# Patient Record
Sex: Female | Born: 1965 | Hispanic: No | Smoking: Current every day smoker
Health system: Southern US, Community
[De-identification: ages and names within clinical notes are randomized; demographics above are authoritative.]

---

## 1998-10-21 ENCOUNTER — Emergency Department (HOSPITAL_COMMUNITY): Admission: EM | Admit: 1998-10-21 | Discharge: 1998-10-21 | Payer: Self-pay | Admitting: Internal Medicine

## 1998-11-04 ENCOUNTER — Emergency Department (HOSPITAL_COMMUNITY): Admission: EM | Admit: 1998-11-04 | Discharge: 1998-11-04 | Payer: Self-pay

## 1998-11-11 ENCOUNTER — Emergency Department (HOSPITAL_COMMUNITY): Admission: EM | Admit: 1998-11-11 | Discharge: 1998-11-11 | Payer: Self-pay | Admitting: Emergency Medicine

## 1998-12-23 ENCOUNTER — Other Ambulatory Visit: Admission: RE | Admit: 1998-12-23 | Discharge: 1998-12-23 | Payer: Self-pay | Admitting: *Deleted

## 1998-12-23 ENCOUNTER — Encounter: Admission: RE | Admit: 1998-12-23 | Discharge: 1998-12-23 | Payer: Self-pay | Admitting: Family Medicine

## 2000-01-05 ENCOUNTER — Emergency Department (HOSPITAL_COMMUNITY): Admission: EM | Admit: 2000-01-05 | Discharge: 2000-01-05 | Payer: Self-pay | Admitting: Emergency Medicine

## 2000-01-19 ENCOUNTER — Encounter: Admission: RE | Admit: 2000-01-19 | Discharge: 2000-01-19 | Payer: Self-pay | Admitting: Family Medicine

## 2000-09-14 ENCOUNTER — Encounter: Payer: Self-pay | Admitting: Emergency Medicine

## 2000-09-14 ENCOUNTER — Emergency Department (HOSPITAL_COMMUNITY): Admission: EM | Admit: 2000-09-14 | Discharge: 2000-09-14 | Payer: Self-pay | Admitting: Emergency Medicine

## 2001-07-18 ENCOUNTER — Encounter: Admission: RE | Admit: 2001-07-18 | Discharge: 2001-07-18 | Payer: Self-pay | Admitting: Family Medicine

## 2002-10-17 ENCOUNTER — Encounter (INDEPENDENT_AMBULATORY_CARE_PROVIDER_SITE_OTHER): Payer: Self-pay | Admitting: *Deleted

## 2002-10-17 LAB — CONVERTED CEMR LAB

## 2002-10-18 ENCOUNTER — Encounter: Admission: RE | Admit: 2002-10-18 | Discharge: 2002-10-18 | Payer: Self-pay | Admitting: Family Medicine

## 2002-10-18 ENCOUNTER — Other Ambulatory Visit: Admission: RE | Admit: 2002-10-18 | Discharge: 2002-10-18 | Payer: Self-pay | Admitting: Family Medicine

## 2002-10-18 ENCOUNTER — Encounter: Payer: Self-pay | Admitting: Sports Medicine

## 2002-10-18 ENCOUNTER — Encounter: Admission: RE | Admit: 2002-10-18 | Discharge: 2002-10-18 | Payer: Self-pay | Admitting: Sports Medicine

## 2005-03-24 ENCOUNTER — Emergency Department (HOSPITAL_COMMUNITY): Admission: EM | Admit: 2005-03-24 | Discharge: 2005-03-24 | Payer: Self-pay | Admitting: Emergency Medicine

## 2006-06-15 DIAGNOSIS — F329 Major depressive disorder, single episode, unspecified: Secondary | ICD-10-CM

## 2006-06-15 DIAGNOSIS — R12 Heartburn: Secondary | ICD-10-CM | POA: Insufficient documentation

## 2006-06-15 DIAGNOSIS — G4700 Insomnia, unspecified: Secondary | ICD-10-CM | POA: Insufficient documentation

## 2006-06-15 DIAGNOSIS — F17200 Nicotine dependence, unspecified, uncomplicated: Secondary | ICD-10-CM

## 2006-06-15 DIAGNOSIS — N946 Dysmenorrhea, unspecified: Secondary | ICD-10-CM | POA: Insufficient documentation

## 2006-06-16 ENCOUNTER — Encounter (INDEPENDENT_AMBULATORY_CARE_PROVIDER_SITE_OTHER): Payer: Self-pay | Admitting: *Deleted

## 2006-09-07 ENCOUNTER — Ambulatory Visit: Payer: Self-pay | Admitting: Sports Medicine

## 2006-09-07 ENCOUNTER — Encounter: Payer: Self-pay | Admitting: Family Medicine

## 2006-09-07 DIAGNOSIS — R519 Headache, unspecified: Secondary | ICD-10-CM | POA: Insufficient documentation

## 2006-09-07 DIAGNOSIS — R51 Headache: Secondary | ICD-10-CM

## 2006-09-08 ENCOUNTER — Encounter: Payer: Self-pay | Admitting: *Deleted

## 2006-09-14 ENCOUNTER — Encounter: Payer: Self-pay | Admitting: Family Medicine

## 2006-09-21 ENCOUNTER — Telehealth: Payer: Self-pay | Admitting: Family Medicine

## 2006-09-25 ENCOUNTER — Telehealth (INDEPENDENT_AMBULATORY_CARE_PROVIDER_SITE_OTHER): Payer: Self-pay | Admitting: *Deleted

## 2006-11-08 ENCOUNTER — Telehealth: Payer: Self-pay | Admitting: Family Medicine

## 2007-02-01 ENCOUNTER — Telehealth (INDEPENDENT_AMBULATORY_CARE_PROVIDER_SITE_OTHER): Payer: Self-pay | Admitting: *Deleted

## 2007-02-09 ENCOUNTER — Ambulatory Visit: Payer: Self-pay | Admitting: Family Medicine

## 2007-02-09 ENCOUNTER — Encounter: Payer: Self-pay | Admitting: *Deleted

## 2007-03-13 ENCOUNTER — Ambulatory Visit (HOSPITAL_COMMUNITY): Admission: RE | Admit: 2007-03-13 | Discharge: 2007-03-13 | Payer: Self-pay | Admitting: Family Medicine

## 2007-07-12 ENCOUNTER — Ambulatory Visit: Payer: Self-pay | Admitting: Family Medicine

## 2007-07-12 DIAGNOSIS — J309 Allergic rhinitis, unspecified: Secondary | ICD-10-CM | POA: Insufficient documentation

## 2007-10-25 ENCOUNTER — Ambulatory Visit: Payer: Self-pay | Admitting: Family Medicine

## 2007-10-25 DIAGNOSIS — L219 Seborrheic dermatitis, unspecified: Secondary | ICD-10-CM | POA: Insufficient documentation

## 2008-01-14 ENCOUNTER — Telehealth: Payer: Self-pay | Admitting: *Deleted

## 2008-04-02 ENCOUNTER — Ambulatory Visit: Payer: Self-pay | Admitting: Family Medicine

## 2008-04-02 ENCOUNTER — Telehealth (INDEPENDENT_AMBULATORY_CARE_PROVIDER_SITE_OTHER): Payer: Self-pay | Admitting: Family Medicine

## 2008-04-02 ENCOUNTER — Encounter (INDEPENDENT_AMBULATORY_CARE_PROVIDER_SITE_OTHER): Payer: Self-pay | Admitting: Family Medicine

## 2008-04-02 DIAGNOSIS — N951 Menopausal and female climacteric states: Secondary | ICD-10-CM | POA: Insufficient documentation

## 2008-04-03 ENCOUNTER — Ambulatory Visit: Payer: Self-pay | Admitting: Family Medicine

## 2008-04-03 ENCOUNTER — Encounter (INDEPENDENT_AMBULATORY_CARE_PROVIDER_SITE_OTHER): Payer: Self-pay | Admitting: Family Medicine

## 2008-04-03 LAB — CONVERTED CEMR LAB
ALT: 15 units/L (ref 0–35)
AST: 16 units/L (ref 0–37)
Alkaline Phosphatase: 50 units/L (ref 39–117)
Creatinine, Ser: 0.88 mg/dL (ref 0.40–1.20)
FSH: 5.9 milliintl units/mL
TSH: 0.712 microintl units/mL (ref 0.350–4.50)
Total Bilirubin: 0.5 mg/dL (ref 0.3–1.2)
Total CHOL/HDL Ratio: 2.7
VLDL: 21 mg/dL (ref 0–40)

## 2008-04-04 ENCOUNTER — Encounter (INDEPENDENT_AMBULATORY_CARE_PROVIDER_SITE_OTHER): Payer: Self-pay | Admitting: Family Medicine

## 2008-05-02 ENCOUNTER — Encounter (INDEPENDENT_AMBULATORY_CARE_PROVIDER_SITE_OTHER): Payer: Self-pay | Admitting: Family Medicine

## 2008-05-02 ENCOUNTER — Ambulatory Visit (HOSPITAL_COMMUNITY): Admission: RE | Admit: 2008-05-02 | Discharge: 2008-05-02 | Payer: Self-pay | Admitting: Family Medicine

## 2008-09-08 ENCOUNTER — Emergency Department (HOSPITAL_COMMUNITY): Admission: EM | Admit: 2008-09-08 | Discharge: 2008-09-09 | Payer: Self-pay | Admitting: Emergency Medicine

## 2009-06-17 ENCOUNTER — Ambulatory Visit: Payer: Self-pay | Admitting: Family Medicine

## 2009-06-18 ENCOUNTER — Telehealth (INDEPENDENT_AMBULATORY_CARE_PROVIDER_SITE_OTHER): Payer: Self-pay | Admitting: *Deleted

## 2009-06-28 ENCOUNTER — Emergency Department (HOSPITAL_COMMUNITY): Admission: EM | Admit: 2009-06-28 | Discharge: 2009-06-28 | Payer: Self-pay | Admitting: Emergency Medicine

## 2009-07-09 ENCOUNTER — Ambulatory Visit (HOSPITAL_COMMUNITY): Admission: RE | Admit: 2009-07-09 | Discharge: 2009-07-09 | Payer: Self-pay | Admitting: Emergency Medicine

## 2009-09-28 ENCOUNTER — Telehealth: Payer: Self-pay | Admitting: *Deleted

## 2010-05-18 NOTE — Progress Notes (Signed)
Summary: Rx Prob  Phone Note Call from Patient Call back at [facility] Phone [phone]   Caller: Patient Summary of Call: The scripts that were sent in for her yesterday need to be sent to the [facility]. Initial call taken by: Clydell Hakim,  June 18, 2009 12:14 PM  Follow-up for Phone Call        Rx for flonase and claritin called to [facility] pharmacy. the otther meds they did not have. patient notified..called [facility] and cancelled the  claritin and flonase. Follow-up by: Theresia Lo RN,  June 18, 2009 5:12 PM

## 2010-05-18 NOTE — Assessment & Plan Note (Signed)
Summary: cpe,df   Vital Signs:  Patient profile:   45 year old female Height:      65.0 inches Weight:      126.1 pounds BMI:     21.06 Temp:     97.8 degrees F Pulse rate:   77 / minute BP sitting:   130 / 85  Vitals Entered By: Starleen Blue RN (June 17, 2009 9:51 AM) CC: cpp Is Patient Diabetic? No Pain Assessment Patient in pain? no        Primary Care Provider:  Eustaquio Boyden  MD  CC:  cpp.  History of Present Illness: CC: CPE  no concerns today.  last pap smear was 03/2008 - normal.  always had normal pap smears.  advised only needs q3 years, but patient desires pap smear today as all ready to do.  Last mammogram 02/2008 - normal.  would like repeat this year.    Smoking - 1ppd, contemplative.  Habits & Providers  Alcohol-Tobacco-Diet     Alcohol drinks/day: <1     Tobacco Status: current     Cigarette Packs/Day: 1.0  Exercise-Depression-Behavior     Drug Use: never     Seat Belt Use: always     Sun Exposure: infrequent  Current Medications (verified): 1)  Anaprox Ds 550 Mg  Tabs (Naproxen Sodium) .Marland Kitchen.. 1 Tab By Mouth Two Times A Day As Needed For Pain.  Take With Food 2)  Claritin 10 Mg  Tabs (Loratadine) .Marland Kitchen.. 1 Tab By Mouth Daily 3)  Flonase 50 Mcg/act  Susp (Fluticasone Propionate) .Marland Kitchen.. 1 Spray Per Nostril Daily 4)  Selenium Sulf-Pyrithione-Urea 2.25 %  Sham (Selenium Sulf-Pyrithione-Urea) .... Use Shampoo Twice Weekly X 2 Weeks, Then Once Weekly. Disp: One Bottle 5)  Nizoral 2 % Sham (Ketoconazole) .... Use Twice Weekly  Disp: 1 Large Bottle  Allergies (verified): No Known Drug Allergies  Past History:  Past medical, surgical, family and social histories (including risk factors) reviewed for relevance to current acute and chronic problems.  Past Medical History: Reviewed history from 09/07/2006 and no changes required. fibrocystic breast disease G2P2003 C-Section x 2  (twins, single birth)  trichomonas vaginitis 10/2002  Past Surgical  History: Reviewed history from 09/07/2006 and no changes required. BTL - 04/18/1988, C-section  Family History: Reviewed history from 04/02/2008 and no changes required. brother-killed age 62yo of GSW dad-healthy in his 80s mom-died at 31 of brain and abdominal cancer no breast cancer, no diabetes, no heart disease/MI/CVA  Social History: Reviewed history from 04/02/2008 and no changes required. 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  no adenopathy Breasts:  fibrocystic breasts (to start period), no tenderness to palpation, skin changes or nipple discharge Lungs:  Normal respiratory effort, chest expands symmetrically. Lungs are clear to auscultation, no crackles or wheezes. Heart:  Normal rate and regular rhythm. S1 and S2 normal without gallop, murmur, click, rub or other extra sounds. Abdomen:  Bowel sounds positive,abdomen soft and non-tender without masses, organomegaly or hernias noted. Genitalia:  Pelvic Exam:        External: normal female genitalia without  lesions or masses        Vagina: normal without lesions or masses, thin whitish discharge        Cervix: normal without lesions or masses        Adnexa: normal bimanual exam without masses or fullness        Uterus: normal by palpation        Pap smear: performed Pulses:  2+ periph pulses Extremities:  No clubbing, cyanosis, edema, or deformity noted with normal full range of motion of all joints.   Skin:  Intact without suspicious  lesions or rashes   Impression & Recommendations:  Problem # 1:  ROUTINE GENERAL MEDICAL EXAM@HEALTH  CARE FACL (ICD-V70.0) healthy 45 yo.  to get mammogram tday.  if pap normal, may space out to q3 years.  feels safe at home.  cholesterol and BMP screen from 2009 WNL.  Orders: [facility] - Est  40-64 yrs (04540)  Problem # 2:  SCREENING FOR MALIGNANT NEOPLASM, CERVIX (ICD-V76.2)  Orders: Pap Smear-[facility] (98119-14782) [facility] - Est  40-64 yrs (95621)  Problem # 3:  TOBACCO DEPENDENCE (ICD-305.1)  Encouraged smoking cessation and discussed different methods for smoking cessation.   Complete Medication List: 1)  Anaprox Ds 550 Mg Tabs (Naproxen sodium) .Marland Kitchen.. 1 tab by mouth two times a day as needed for pain.  take with food 2)  Claritin 10 Mg Tabs (Loratadine) .Marland Kitchen.. 1 tab by mouth daily 3)  Flonase 50 Mcg/act Susp (Fluticasone propionate) .Marland Kitchen.. 1 spray per nostril daily 4)  Selenium Sulf-pyrithione-urea 2.25 % Sham (Selenium sulf-pyrithione-urea) .... Use shampoo twice weekly x 2 weeks, then once weekly. disp: one bottle 5)  Nizoral 2 % Sham (Ketoconazole) .... Use twice weekly  disp: 1 large bottle  Patient Instructions: 1)  Pleasure to meet you today. 2)  If your pap smear is normal, you don't need another one for 3 years. 3)  We will send you to get mammogram. 4)  Keep thinking about benefits versus risks of smoking.  Remember we are here to help you quit whenever you are ready! 5)  Call clinic with questions.  Pleasure to meet you today. Prescriptions: NIZORAL 2 % SHAM (KETOCONAZOLE) use twice weekly  disp: 1 large bottle  #1 x 11   Entered and Authorized by:   Eustaquio Boyden  MD   Signed by:   Eustaquio Boyden  MD on 06/17/2009   Method used:   Electronically to        [facility] [phone]* (retail)       [address]       [place], [place]  [address]       Ph: [phone]       Fax: [phone]   RxID:   [id number] SELENIUM SULF-PYRITHIONE-UREA 2.25 %  SHAM (SELENIUM  SULF-PYRITHIONE-UREA) Use shampoo twice weekly x 2 weeks, then once weekly. disp: one bottle  #1 x 11   Entered and Authorized by:   Eustaquio Boyden  MD   Signed by:   Eustaquio Boyden  MD on 06/17/2009   Method used:   Electronically to        [facility] [phone]* (retail)       [address]       [place], [place]  [address]       Ph: [phone]       Fax: [phone]   RxID:   [id number] FLONASE 50 MCG/ACT  SUSP (FLUTICASONE PROPIONATE) 1 spray per nostril daily  #1 x 11  Entered and Authorized by:   Eustaquio Boyden  MD   Signed by:   Eustaquio Boyden  MD on 06/17/2009   Method used:   Electronically to        [facility] [phone]* (retail)       [address]       [place], [place]  [address]       Ph: [phone]       Fax: [phone]   RxID:   [id number] CLARITIN 10 MG  TABS (LORATADINE) 1 tab by mouth daily  #31 x 11   Entered and Authorized by:   Eustaquio Boyden  MD   Signed by:   Eustaquio Boyden  MD on 06/17/2009   Method used:   Electronically to        [facility] [phone]* (retail)       [address]       [place], [place]  [address]       Ph: [phone]       Fax: [phone]   RxID:   [id number] ANAPROX DS 550 MG  TABS (NAPROXEN SODIUM) 1 tab by mouth two times a day as needed for pain.  Take with food  #60 x 11   Entered and Authorized by:   Eustaquio Boyden  MD   Signed by:   Eustaquio Boyden  MD on 06/17/2009   Method used:   Electronically to        [facility] [phone]* (retail)       [address]       [place], [place]  [address]       Ph: [phone]       Fax: [phone]   RxID:   [id number]   Appended Document: cpe,df    Clinical Lists Changes  Orders: Added new Test order of Pap Smear-[facility] ([id number]) - Signed Added new Test order of Mammogram (Screening) (Mammo) - Signed Observations: Added new observation of MAMMO DUE: 05/02/2009 (06/17/2009 10:54) Added new observation of  MAMMRECACT: Ordered (06/17/2009 10:54) Added new observation of PAPRECACT: Ordered (06/17/2009 10:54) Added new observation of NURSEINSTR: Pap smear today Schedule screening mammogram (see order)  (06/17/2009 10:54) Added new observation of DM PROGRESS: N/A (06/17/2009 10:54) Added new observation of DM FSREVIEW: N/A (06/17/2009 10:54) Added new observation of HTN PROGRESS: N/A (06/17/2009 10:54) Added new observation of HTN FSREVIEW: N/A (06/17/2009 10:54) Added new observation of LIPID PROGRS: N/A (06/17/2009 10:54) Added new observation of LIPID FSREVW: N/A (06/17/2009 10:54) Added new observation of MAMMOGRAM: done (05/02/2008 10:56)       Prevention & Chronic Care Immunizations   Influenza vaccine: Not documented    Tetanus booster: 04/18/2000: Done.   Tetanus booster due: 04/18/2010    Pneumococcal vaccine: Not documented  Other Screening   Pap smear: NEGATIVE FOR INTRAEPITHELIAL LESIONS OR MALIGNANCY.  (04/02/2008)   Pap smear action/deferral: Ordered  (06/17/2009)   Pap smear due: 10/17/2003    Mammogram: done  (05/02/2008)   Mammogram action/deferral: Ordered  (06/17/2009)   Mammogram due: 05/02/2009   Smoking status: current  (06/17/2009)   Smoking cessation counseling: yes  (04/02/2008)  Lipids   Total Cholesterol: 164  (04/03/2008)   LDL: 82  (04/03/2008)   LDL Direct: Not documented   HDL: 61  (04/03/2008)   Triglycerides: 103  (04/03/2008)   Nursing Instructions: Pap smear today Schedule screening mammogram (see order)      -  Date:  05/02/2008    Mammogram done

## 2010-05-18 NOTE — Progress Notes (Signed)
Summary: Rx  Phone Note Refill Request   Refills Requested: Medication #1:  SELENIUM SULF-PYRITHIONE-UREA 2.25 %  SHAM Use shampoo twice weekly x 2 weeks send to [facility]  Initial call taken by: Knox Royalty,  September 28, 2009 3:15 PM  Follow-up for Phone Call        done. Follow-up by: Arlyss Repress CMA,,  September 28, 2009 5:22 PM

## 2010-05-26 ENCOUNTER — Encounter: Payer: Self-pay | Admitting: *Deleted

## 2010-06-08 ENCOUNTER — Other Ambulatory Visit (HOSPITAL_COMMUNITY): Payer: Self-pay | Admitting: Diagnostic Radiology

## 2010-06-08 DIAGNOSIS — Z1231 Encounter for screening mammogram for malignant neoplasm of breast: Secondary | ICD-10-CM

## 2010-07-12 ENCOUNTER — Ambulatory Visit (HOSPITAL_COMMUNITY): Payer: Self-pay | Attending: Diagnostic Radiology

## 2011-01-12 ENCOUNTER — Inpatient Hospital Stay (INDEPENDENT_AMBULATORY_CARE_PROVIDER_SITE_OTHER)
Admission: RE | Admit: 2011-01-12 | Discharge: 2011-01-12 | Disposition: A | Discharge status: Home / Self Care | Payer: Self-pay | Source: Ambulatory Visit | Attending: Emergency Medicine | Admitting: Emergency Medicine

## 2011-01-12 DIAGNOSIS — N390 Urinary tract infection, site not specified: Secondary | ICD-10-CM

## 2011-01-12 LAB — POCT URINALYSIS DIP (DEVICE)
Nitrite: NEGATIVE
Protein, ur: NEGATIVE mg/dL
pH: 6.5 (ref 5.0–8.0)

## 2011-01-14 LAB — URINE CULTURE: Culture  Setup Time: 201209262158

## 2011-01-17 ENCOUNTER — Ambulatory Visit (INDEPENDENT_AMBULATORY_CARE_PROVIDER_SITE_OTHER): Payer: Self-pay | Admitting: Family Medicine

## 2011-01-17 ENCOUNTER — Encounter: Payer: Self-pay | Admitting: Family Medicine

## 2011-01-17 DIAGNOSIS — Z13220 Encounter for screening for lipoid disorders: Secondary | ICD-10-CM

## 2011-01-17 DIAGNOSIS — F17200 Nicotine dependence, unspecified, uncomplicated: Secondary | ICD-10-CM

## 2011-01-17 DIAGNOSIS — R300 Dysuria: Secondary | ICD-10-CM

## 2011-01-17 DIAGNOSIS — Z720 Tobacco use: Secondary | ICD-10-CM

## 2011-01-17 DIAGNOSIS — N390 Urinary tract infection, site not specified: Secondary | ICD-10-CM | POA: Insufficient documentation

## 2011-01-17 DIAGNOSIS — Z0000 Encounter for general adult medical examination without abnormal findings: Secondary | ICD-10-CM | POA: Insufficient documentation

## 2011-01-17 LAB — LIPID PANEL
Cholesterol: 152 mg/dL (ref 0–200)
LDL Cholesterol: 95 mg/dL (ref 0–99)
Total CHOL/HDL Ratio: 3.9 Ratio

## 2011-01-17 MED ORDER — NAPROXEN 500 MG PO TABS: 500.0000 mg | ORAL_TABLET | Freq: Two times a day (BID) | ORAL | Status: DC | PRN

## 2011-01-17 MED ORDER — FLUTICASONE PROPIONATE 50 MCG/ACT NA SUSP: 2.0000 | Freq: Every day | NASAL | Status: DC

## 2011-01-17 NOTE — Assessment & Plan Note (Addendum)
Patient still smoking is not ready to quit. Encouraged smoking cessation. Broached issue with patient of lung findings (mild wheezing) in setting a smoking as well as the need for PFTs. Will follow up in 3-6 months.

## 2011-01-17 NOTE — Progress Notes (Signed)
  Subjective:    Patient ID: Robin Carlson, female    DOB: [birth year], 46 y.o.   MRN: [id number]  HPI Patient is here for annual exam and new physician visit.  Of note patient was recently seen at [facility] urgent care last week for dysuria with working diagnosis of UTI. Patient was placed on a seven-day course of twice a day Cipro for this. Per patient, she's been taking this once daily. Patient states that dysuria has improved somewhat; however, has still persisted. No fever, abdominal pain, nausea, vomiting.   Patient currently desires a Pap smear as patient reports that she has a family history of cancer. Patient states that her mother had cancer.The patient is unsure what kind.  No other known family members has had cancer per patient. Of note most recent Pap smears in 2009 and 2011 were negative.   Patient also currently is an active smoker smoking approximately one to one half pack per day. Patient states that she's not ready to quit.   Review of Systems See HPI     Objective:   Physical Exam Gen: up in chair, NAD HEENT: NCAT, EOMI, TMs clear bilaterally CV: RRR, no murmurs auscultated PULM: good overall air movement, faint wheezes bilaterally ABD: S/NT/+ bowel sounds  EXT: 2+ peripheral pulses   Neuro: grossly normal  Assessment & Plan:

## 2011-01-17 NOTE — Assessment & Plan Note (Addendum)
Will check a urine culture today as patient is currently being partially treated for UTI secondary to compliance issues. Patient instructed to finish regimen as originally prescribed. Will change therapy if urine cultures indicative. Red flags were discussed for return. Patient agreeable.

## 2011-01-17 NOTE — Patient Instructions (Signed)
It was good to meet you today You don't need Pap smear until 2014 Try to quit smoking We're getting a urine culture for urinary issue I will like to set you up for lung studies because of her smoking history Come back to see me in 3-6 months Call with any questions  God Bless,  Doree Albee MD

## 2011-01-17 NOTE — Assessment & Plan Note (Addendum)
Grossly normal exam. Will hold on Pap as most recent Pap in 2011 was negative for any intraepithelial lesions. Will set up for PFTs given long standing smoking history.

## 2011-01-18 ENCOUNTER — Encounter: Payer: Self-pay | Admitting: Family Medicine

## 2011-01-18 LAB — URINE CULTURE: Organism ID, Bacteria: NO GROWTH

## 2011-01-19 ENCOUNTER — Ambulatory Visit (HOSPITAL_COMMUNITY): Admission: RE | Admit: 2011-01-19 | Payer: Self-pay | Source: Ambulatory Visit

## 2011-01-19 ENCOUNTER — Encounter (HOSPITAL_COMMUNITY): Payer: Self-pay

## 2011-01-20 ENCOUNTER — Ambulatory Visit (HOSPITAL_COMMUNITY)
Admission: RE | Admit: 2011-01-20 | Discharge: 2011-01-20 | Disposition: A | Discharge status: Home / Self Care | Payer: Self-pay | Source: Ambulatory Visit | Attending: Family Medicine | Admitting: Family Medicine

## 2011-01-20 DIAGNOSIS — F17200 Nicotine dependence, unspecified, uncomplicated: Secondary | ICD-10-CM | POA: Insufficient documentation

## 2011-03-24 ENCOUNTER — Ambulatory Visit (INDEPENDENT_AMBULATORY_CARE_PROVIDER_SITE_OTHER): Payer: Self-pay | Admitting: Family Medicine

## 2011-03-24 ENCOUNTER — Encounter: Payer: Self-pay | Admitting: Family Medicine

## 2011-03-24 DIAGNOSIS — R1031 Right lower quadrant pain: Secondary | ICD-10-CM

## 2011-03-24 DIAGNOSIS — R109 Unspecified abdominal pain: Secondary | ICD-10-CM

## 2011-03-24 DIAGNOSIS — N730 Acute parametritis and pelvic cellulitis: Secondary | ICD-10-CM

## 2011-03-24 LAB — CBC WITH DIFFERENTIAL/PLATELET
Basophils Absolute: 0 10*3/uL (ref 0.0–0.1)
Basophils Relative: 0 % (ref 0–1)
Eosinophils Absolute: 0.1 10*3/uL (ref 0.0–0.7)
MCH: 30 pg (ref 26.0–34.0)
MCHC: 31.9 g/dL (ref 30.0–36.0)
Neutrophils Relative %: 60 % (ref 43–77)
Platelets: 352 10*3/uL (ref 150–400)

## 2011-03-24 LAB — COMPREHENSIVE METABOLIC PANEL
ALT: 14 U/L (ref 0–35)
AST: 17 U/L (ref 0–37)
Alkaline Phosphatase: 50 U/L (ref 39–117)
Sodium: 139 mEq/L (ref 135–145)
Total Bilirubin: 0.4 mg/dL (ref 0.3–1.2)
Total Protein: 6.9 g/dL (ref 6.0–8.3)

## 2011-03-24 LAB — POCT WET PREP (WET MOUNT): Yeast Wet Prep HPF POC: NEGATIVE

## 2011-03-24 LAB — POCT URINALYSIS DIPSTICK
Ketones, UA: NEGATIVE
Protein, UA: NEGATIVE
Spec Grav, UA: 1.015
pH, UA: 8.5

## 2011-03-24 MED ORDER — CEFTRIAXONE SODIUM 250 MG IJ SOLR: 250.0000 mg | Freq: Once | INTRAMUSCULAR | Status: DC

## 2011-03-24 MED ORDER — METRONIDAZOLE 500 MG PO TABS: 500.0000 mg | ORAL_TABLET | Freq: Two times a day (BID) | ORAL | Status: AC

## 2011-03-24 MED ORDER — DOXYCYCLINE HYCLATE 100 MG PO TABS: 100.0000 mg | ORAL_TABLET | Freq: Two times a day (BID) | ORAL | Status: AC

## 2011-03-24 MED ORDER — CEFTRIAXONE SODIUM 250 MG IJ SOLR
250.0000 mg | Freq: Once | INTRAMUSCULAR | Status: AC
  Administered 2011-03-24: 250 mg via INTRAMUSCULAR

## 2011-03-24 MED ORDER — POLYETHYLENE GLYCOL 3350 17 GM/SCOOP PO POWD: 17.0000 g | Freq: Three times a day (TID) | ORAL | Status: DC

## 2011-03-24 NOTE — Assessment & Plan Note (Addendum)
There is a relatively broad differential for this with relatively higher suspicion for GYN and GI malignancy given sxs and family history. Pt does have cervical motion tenderness in setting of + sexual activity and vaginal discharge. Will treat for PID. Wet prep, CG/Chl/UA. Will also treat for constipation with tid miralax.  Plan to obtain CT Abd/Pelvis w/ and w/o contrast to evaluate for malignancy. Will also obtain baseline labs including CBC and CMET.  Discussed overall treatment plan at length with pt. Will plan to follow up next week.  Would also like to promptly set up pt for colonoscopy given family history in the near future pending imaging results.

## 2011-03-24 NOTE — Progress Notes (Signed)
  Subjective:    Patient ID: Robin Carlson, female    DOB: 11-12-1965, 45 y.o.   MRN: [id number]  HPI Pt presents today with persistent constipation and R lower abdominal pain. Pt states that these sxs have been present for the last 2 months. Pt states that she has been using enema to help with bowel movements. BMs have been hard and pellet in nature. Pain is mainly in R lower quadrant and does not radiate. No associated nausea, vomiting, or diarrhea. Pt has noticed some blood after wiping and strenuous bowel movements. From a risk factor standpoint, pt states that her mother died at age 12 of cancer, but pt denies that it was related to colon cancer. Pt is currently a smoker. Pt denies any abdominal distension or unintentional weight loss.   Pt also reports + mild vaginal discharge. Vaginal discharge is non foul smelling. Pt is currently sexually active.    Review of Systems See HPI     Objective:   Physical Exam Gen: up in chair, NAD HEENT: NCAT, EOMI, TMs clear bilaterally CV: RRR, no murmurs auscultated PULM: CTAB, no wheezes, rales, rhoncii ABD: S/+ bowel sounds/+ mild RLQ pain GU: normal external genitalia, mild vaginal discharge, + CMT Rectal: no visible hemorrhoids or anal fissures.  EXT: 2+ peripheral pulses         Assessment & Plan:

## 2011-03-24 NOTE — Patient Instructions (Signed)
It was good to see you today I am starting you on antibiotics for your vaginal discharge I am also doing some blood work I would like to get some imaging of your belly Come back to see me in 1 week, Call with any questions, God Bless,  Doree Albee MD

## 2011-03-25 ENCOUNTER — Ambulatory Visit (HOSPITAL_COMMUNITY)
Admission: RE | Admit: 2011-03-25 | Discharge: 2011-03-25 | Disposition: A | Discharge status: Home / Self Care | Payer: Self-pay | Source: Ambulatory Visit | Attending: Family Medicine | Admitting: Family Medicine

## 2011-03-25 DIAGNOSIS — R1031 Right lower quadrant pain: Secondary | ICD-10-CM | POA: Insufficient documentation

## 2011-03-25 DIAGNOSIS — K5900 Constipation, unspecified: Secondary | ICD-10-CM | POA: Insufficient documentation

## 2011-03-25 DIAGNOSIS — N289 Disorder of kidney and ureter, unspecified: Secondary | ICD-10-CM | POA: Insufficient documentation

## 2011-03-25 DIAGNOSIS — K7689 Other specified diseases of liver: Secondary | ICD-10-CM | POA: Insufficient documentation

## 2011-03-25 LAB — GC/CHLAMYDIA PROBE AMP, GENITAL
Chlamydia, DNA Probe: NEGATIVE
GC Probe Amp, Genital: NEGATIVE

## 2011-03-25 MED ORDER — IOHEXOL 300 MG/ML  SOLN
80.0000 mL | Freq: Once | INTRAMUSCULAR | Status: AC | PRN
  Administered 2011-03-25: 80 mL via INTRAVENOUS

## 2011-03-31 ENCOUNTER — Ambulatory Visit (INDEPENDENT_AMBULATORY_CARE_PROVIDER_SITE_OTHER): Payer: Self-pay | Admitting: Family Medicine

## 2011-03-31 ENCOUNTER — Encounter: Payer: Self-pay | Admitting: Family Medicine

## 2011-03-31 DIAGNOSIS — N730 Acute parametritis and pelvic cellulitis: Secondary | ICD-10-CM | POA: Insufficient documentation

## 2011-03-31 DIAGNOSIS — K5900 Constipation, unspecified: Secondary | ICD-10-CM | POA: Insufficient documentation

## 2011-03-31 DIAGNOSIS — A499 Bacterial infection, unspecified: Secondary | ICD-10-CM

## 2011-03-31 DIAGNOSIS — B9689 Other specified bacterial agents as the cause of diseases classified elsewhere: Secondary | ICD-10-CM

## 2011-03-31 DIAGNOSIS — N760 Acute vaginitis: Secondary | ICD-10-CM

## 2011-03-31 NOTE — Assessment & Plan Note (Signed)
Recent + whiff test on wet prep from last clinical visit. Treated with flagyl. Now resolved.

## 2011-03-31 NOTE — Assessment & Plan Note (Signed)
Symptomatically improved with tid miralax. Will continue with this for additional week. Then transition to once daily use. Will also set up pt for colonoscopy given family history. Discussed red flags for reevaluation. Will follow up in 2-4 weeks.

## 2011-03-31 NOTE — Assessment & Plan Note (Signed)
Symptomatically improved s/p treatment. Will continue to follow.

## 2011-03-31 NOTE — Progress Notes (Signed)
S:  Pt is here for a follow up evaluation of abdominal pain and vaginal discharge. Pt was seen last week for recurrent lower abdominal pain with some concern for gyn/gi malignancy in setting of first degree relative with ?GI cancer (mother) as well as longstanding recurrent constipation with no significant constipation RFs (pt not on iron or narcotics). CT Abd and Pelvis was obtained which showed large stool burden but no visible GYN/GI lesions. Pt was treated for PID as well as constipation with TID miralax.  Today, pt states that sxs are much improved. BMs are now regular and lower abd pain is also much improved. Vaginal discharge has also resolved.   O:  Current outpatient prescriptions:ciprofloxacin (CIPRO) 500 MG tablet, Take 500 mg by mouth 2 (two) times daily.  , Disp: , Rfl: ;  doxycycline (VIBRA-TABS) 100 MG tablet, Take 1 tablet (100 mg total) by mouth 2 (two) times daily., Disp: 28 tablet, Rfl: 0;  fluticasone (FLONASE) 50 MCG/ACT nasal spray, Place 2 sprays into the nose daily., Disp: 16 g, Rfl: 6;  ketoconazole (NIZORAL) 2 % shampoo, Apply topically twice a week.  , Disp: , Rfl:  loratadine (CLARITIN) 10 MG tablet, Take 10 mg by mouth daily.  , Disp: , Rfl: ;  metroNIDAZOLE (FLAGYL) 500 MG tablet, Take 1 tablet (500 mg total) by mouth 2 (two) times daily., Disp: 14 tablet, Rfl: 0;  naproxen (NAPROSYN) 500 MG tablet, Take 1 tablet (500 mg total) by mouth 2 (two) times daily as needed., Disp: 60 tablet, Rfl: 3;  Selenium Sulf-Pyrithione-Urea 2.25 % SHAM, Use shampoo twice weekly x 2 wks, then once weekly. , Disp: , Rfl:   Wt Readings from Last 3 Encounters:  03/31/11 129 lb (58.514 kg)  03/24/11 133 lb (60.328 kg)  01/17/11 127 lb (57.607 kg)   Temp Readings from Last 3 Encounters:  No data found for Temp   BP Readings from Last 3 Encounters:  03/31/11 123/86  03/24/11 118/73  01/17/11 123/84   Pulse Readings from Last 3 Encounters:  03/31/11 81  03/24/11 74  01/17/11 83     General: alert and cooperative Heart: S1, S2 normal, no murmur, rub or gallop, regular rate and rhythm Lungs: clear to auscultation, no wheezes or rales and unlabored breathing Abdomen: abdomen is soft without significant tenderness, masses, organomegaly or guarding Extremities: extremities normal, atraumatic, no cyanosis or edema Skin:no rashes  A/P:

## 2011-03-31 NOTE — Patient Instructions (Signed)
It was good to see you today  Continue with TID miralax until next week Monday and then take this once daily You will need to be set up for a screening colonoscopy Come back to see me in 1 month,  Call with any questions,  God Bless, Doree Albee MD

## 2011-04-04 ENCOUNTER — Telehealth: Payer: Self-pay | Admitting: Family Medicine

## 2011-04-04 NOTE — Telephone Encounter (Signed)
Hasn't heard anything about her referral for colonoscopy - pls advise

## 2011-04-05 NOTE — Telephone Encounter (Signed)
Called pt and informed that we do not have screening colonoscopy through [facility]. Will fwd. To Dr.Newton for advise. Lorenda Hatchet, Renato Battles

## 2011-04-29 ENCOUNTER — Other Ambulatory Visit: Payer: Self-pay | Admitting: Family Medicine

## 2011-04-29 MED ORDER — POLYETHYLENE GLYCOL 3350 17 GM/SCOOP PO POWD: 17.0000 g | Freq: Two times a day (BID) | ORAL | Status: DC

## 2011-06-07 ENCOUNTER — Encounter: Payer: Self-pay | Admitting: Family Medicine

## 2011-06-07 ENCOUNTER — Ambulatory Visit (INDEPENDENT_AMBULATORY_CARE_PROVIDER_SITE_OTHER): Payer: Self-pay | Admitting: Family Medicine

## 2011-06-07 DIAGNOSIS — K5900 Constipation, unspecified: Secondary | ICD-10-CM

## 2011-06-07 DIAGNOSIS — R109 Unspecified abdominal pain: Secondary | ICD-10-CM

## 2011-06-07 MED ORDER — POLYETHYLENE GLYCOL 3350 17 GM/SCOOP PO POWD: 17.0000 g | Freq: Two times a day (BID) | ORAL | Status: AC

## 2011-06-07 NOTE — Progress Notes (Signed)
  Subjective:    Patient ID: Robin Carlson, female    DOB: 1965/05/22, 46 y.o.   MRN: [id number]  HPI Pt presents today for abdominal pain and constipation follow up. Pt had relatively extensive work up in setting of weight loss, ? Family history of GI cancer where pt received CT Abd and Pelvis that was negative for any mass but noted high stool burden. Pt was placed on regimen of scheduled miralax. Pt states that bowel movements have improved with miralax but BMs seem to be dependent on miralax use. Pt states that she uses miralax on a once to twice daily regimen. No abdominal pain, nausea, vomting.   Pt was also requesting refills on norco and diazepam that an outside provider was prescribing. Pt denies this as being a pain center. 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 Pt states that practice has closed and she cannot afford new referred practice. Previous provider was [facility] rxing diazepam and norco. Pt is not aware of previous practices contact information. Pt is not aware of newly referred practices contact information.   Pt still smoking at least 1 PPD. Pt not ready to quit.  Review of Systems See HPI, otherwise ROS negative.     Objective:   Physical Exam Gen: up in chair, NAD CV: RRR, no murmurs auscultated PULM: CTAB, no wheezes, rales, rhoncii ABD: S/NT/+ bowel sounds  EXT: 2+ peripheral pulses         Assessment & Plan:  Outside Medications: Discussed with pt that this issue will need to be addressed with outside practice. Told pt that I would need outside records to review before any decisions would be made about any outside treatment.

## 2011-06-07 NOTE — Patient Instructions (Signed)
It was good to see today Be sure to take the MiraLAX as prescribed We will contact you as to when follow for your colonoscopy Call with any questions Otherwise follow up in 3-6 months God Bless, Doree Albee MD

## 2011-06-08 ENCOUNTER — Telehealth: Payer: Self-pay | Admitting: *Deleted

## 2011-06-08 NOTE — Telephone Encounter (Signed)
Called patient to discuss options for GI referral with [facility]. She refuses to go to [facility] or Dr Loreta Ave right now because she cannot afford it. I told her I will send the referral through [facility] and explained what that was to her. She will notify us once she is able to afford to see Dr Loreta Ave.Robin Carlson, Rodena Medin

## 2011-06-08 NOTE — Assessment & Plan Note (Signed)
Symptomatically improved with miralax. Will continue this regimen. Pt still needs colonoscopy given ? Family history. Will set up for colonoscopy.

## 2011-07-13 ENCOUNTER — Ambulatory Visit (HOSPITAL_COMMUNITY): Payer: Self-pay | Attending: Diagnostic Radiology

## 2011-09-13 ENCOUNTER — Emergency Department (INDEPENDENT_AMBULATORY_CARE_PROVIDER_SITE_OTHER)
Admission: EM | Admit: 2011-09-13 | Discharge: 2011-09-13 | Disposition: A | Discharge status: Home / Self Care | Payer: Self-pay | Source: Home / Self Care | Attending: Family Medicine | Admitting: Family Medicine

## 2011-09-13 ENCOUNTER — Emergency Department (INDEPENDENT_AMBULATORY_CARE_PROVIDER_SITE_OTHER): Payer: Self-pay

## 2011-09-13 ENCOUNTER — Encounter (HOSPITAL_COMMUNITY): Payer: Self-pay | Admitting: *Deleted

## 2011-09-13 DIAGNOSIS — J410 Simple chronic bronchitis: Secondary | ICD-10-CM

## 2011-09-13 DIAGNOSIS — J40 Bronchitis, not specified as acute or chronic: Secondary | ICD-10-CM

## 2011-09-13 LAB — POCT URINALYSIS DIP (DEVICE)
Bilirubin Urine: NEGATIVE
Glucose, UA: NEGATIVE mg/dL
Hgb urine dipstick: NEGATIVE
Ketones, ur: 15 mg/dL — AB
Nitrite: NEGATIVE
Specific Gravity, Urine: 1.01 (ref 1.005–1.030)
pH: 7 (ref 5.0–8.0)

## 2011-09-13 MED ORDER — DEXTROMETHORPHAN POLISTIREX 30 MG/5ML PO LQCR: 60.0000 mg | Freq: Two times a day (BID) | ORAL | Status: AC

## 2011-09-13 MED ORDER — LEVOFLOXACIN 500 MG PO TABS: 500.0000 mg | ORAL_TABLET | Freq: Every day | ORAL | Status: AC

## 2011-09-13 NOTE — Discharge Instructions (Signed)
Take all of medicine, drink lots of fluids, no more smoking, see your doctor if further problems  °

## 2011-09-13 NOTE — ED Provider Notes (Signed)
History     CSN: [id number]  Arrival date & time 09/13/11  1800   First MD Initiated Contact with Patient 09/13/11 1808      Chief Complaint  Patient presents with  . URI    (Consider location/radiation/quality/duration/timing/severity/associated sxs/prior treatment) Patient is a 46 y.o. female presenting with fever. The history is provided by the patient.  Fever Primary symptoms of the febrile illness include fever, cough, shortness of breath, abdominal pain, nausea, vomiting and diarrhea. The current episode started 3 to 5 days ago. This is a new problem. The problem has been gradually worsening.    History reviewed. No pertinent past medical history.  Past Surgical History  Procedure Date  . Cesarean section     No family history on file.  History  Substance Use Topics  . Smoking status: Current Everyday Smoker -- 0.5 packs/day    Types: Cigarettes  . Smokeless tobacco: Not on file  . Alcohol Use: Not on file    OB History    Grav Para Term Preterm Abortions TAB SAB Ect Mult Living                  Review of Systems  Constitutional: Positive for fever.  HENT: Negative.   Respiratory: Positive for cough and shortness of breath.   Gastrointestinal: Positive for nausea, vomiting, abdominal pain and diarrhea.  Genitourinary: Negative.   Musculoskeletal: Positive for back pain.    Allergies  Review of patient's allergies indicates no known allergies.  Home Medications   Current Outpatient Rx  Name Route Sig Dispense Refill  . LORATADINE 10 MG PO TABS Oral Take 10 mg by mouth daily.      Marland Kitchen NAPROXEN 500 MG PO TABS Oral Take 1 tablet (500 mg total) by mouth 2 (two) times daily as needed. 60 tablet 3  . POLYETHYLENE GLYCOL 3350 PO POWD Oral Take 17 g by mouth 2 (two) times daily. 850 g 11  . CIPROFLOXACIN HCL 500 MG PO TABS Oral Take 500 mg by mouth 2 (two) times daily.      Marland Kitchen DEXTROMETHORPHAN POLISTIREX ER 30 MG/5ML PO LQCR Oral Take 10 mLs (60 mg total) by  mouth 2 (two) times daily. 89 mL 0  . FLUTICASONE PROPIONATE 50 MCG/ACT NA SUSP Nasal Place 2 sprays into the nose daily. 16 g 6  . KETOCONAZOLE 2 % EX SHAM Topical Apply topically twice a week.      Marland Kitchen LEVOFLOXACIN 500 MG PO TABS Oral Take 1 tablet (500 mg total) by mouth daily. 7 tablet 0  . SELENIUM SULF-PYRITHIONE-UREA 2.25 % EX SHAM  Use shampoo twice weekly x 2 wks, then once weekly.       BP 108/68  Pulse 90  Temp(Src) 100.4 F (38 C) (Oral)  Resp 20  SpO2 97%  LMP 09/12/2011  Physical Exam  Nursing note and vitals reviewed. Constitutional: She is oriented to person, place, and time. She appears well-developed and well-nourished.  HENT:  Head: Normocephalic.  Right Ear: External ear normal.  Left Ear: External ear normal.  Nose: Nose normal.  Mouth/Throat: Oropharynx is clear and moist.  Eyes: Pupils are equal, round, and reactive to light.  Neck: Normal range of motion. Neck supple.  Cardiovascular: Regular rhythm and normal heart sounds.   Pulmonary/Chest: She has decreased breath sounds. She has rhonchi.  Abdominal: Bowel sounds are normal. There is no tenderness.  Lymphadenopathy:    She has no cervical adenopathy.  Neurological: She is alert and oriented to  person, place, and time.  Skin: Skin is warm and dry.    ED Course  Procedures (including critical care time)  Labs Reviewed  POCT URINALYSIS DIP (DEVICE) - Abnormal; Notable for the following:    Ketones, ur 15 (*)    All other components within normal limits   Dg Chest 2 View  09/13/2011  *RADIOLOGY REPORT*  Clinical Data: 46 year old female with fever cough and congestion.  CHEST - 2 VIEW  Comparison: 09/08/2008.  Findings: Stable scoliosis.  Stable lung volumes.  Cardiac size and mediastinal contours are within normal limits.  No pneumothorax, pulmonary edema, pleural effusion or consolidation.  Biapical scarring is stable or minimally increased and greater on the right. No acute pulmonary airspace  disease, but basilar predominant interstitial markings are increased. Visualized tracheal air column is within normal limits.  No acute osseous abnormality identified.  IMPRESSION: No focal pneumonia.  Increased basilar predominant interstitial markings since 2010 could be related to chronic lung disease or an acute viral / atypical respiratory infection.  Original Report Authenticated By: Harley Hallmark, M.D.     1. Bronchitis due to tobacco use       MDM  U/a neg X-rays reviewed and report per radiologist.         Linna Hoff, MD 09/13/11 2055

## 2011-09-13 NOTE — ED Notes (Signed)
Cough, congestion, body aches since Saturday. Has had fevers today. Last took Nyquil and Insurance account manager plus

## 2012-12-27 ENCOUNTER — Encounter: Payer: Self-pay | Admitting: Emergency Medicine

## 2013-01-17 ENCOUNTER — Ambulatory Visit: Payer: Self-pay

## 2013-01-17 ENCOUNTER — Ambulatory Visit: Payer: Self-pay | Admitting: Emergency Medicine

## 2013-01-20 ENCOUNTER — Emergency Department (HOSPITAL_COMMUNITY)
Admission: EM | Admit: 2013-01-20 | Discharge: 2013-01-20 | Disposition: A | Discharge status: Home / Self Care | Payer: No Typology Code available for payment source | Attending: Emergency Medicine | Admitting: Emergency Medicine

## 2013-01-20 ENCOUNTER — Encounter (HOSPITAL_COMMUNITY): Payer: Self-pay | Admitting: *Deleted

## 2013-01-20 DIAGNOSIS — IMO0002 Reserved for concepts with insufficient information to code with codable children: Secondary | ICD-10-CM | POA: Insufficient documentation

## 2013-01-20 DIAGNOSIS — Y9389 Activity, other specified: Secondary | ICD-10-CM | POA: Insufficient documentation

## 2013-01-20 DIAGNOSIS — Z792 Long term (current) use of antibiotics: Secondary | ICD-10-CM | POA: Insufficient documentation

## 2013-01-20 DIAGNOSIS — T148XXA Other injury of unspecified body region, initial encounter: Secondary | ICD-10-CM

## 2013-01-20 DIAGNOSIS — F17200 Nicotine dependence, unspecified, uncomplicated: Secondary | ICD-10-CM | POA: Insufficient documentation

## 2013-01-20 DIAGNOSIS — Y92410 Unspecified street and highway as the place of occurrence of the external cause: Secondary | ICD-10-CM | POA: Insufficient documentation

## 2013-01-20 DIAGNOSIS — Z79899 Other long term (current) drug therapy: Secondary | ICD-10-CM | POA: Insufficient documentation

## 2013-01-20 MED ORDER — DIAZEPAM 5 MG PO TABS: 5.0000 mg | ORAL_TABLET | Freq: Four times a day (QID) | ORAL | Status: DC | PRN

## 2013-01-20 MED ORDER — KETOROLAC TROMETHAMINE 60 MG/2ML IM SOLN
60.0000 mg | Freq: Once | INTRAMUSCULAR | Status: DC
  Filled 2013-01-20: qty 2

## 2013-01-20 MED ORDER — HYDROCODONE-ACETAMINOPHEN 5-325 MG PO TABS: 2.0000 | ORAL_TABLET | ORAL | Status: DC | PRN
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
## 2013-01-20 NOTE — ED Provider Notes (Signed)
Medical screening examination/treatment/procedure(s) were performed by non-physician practitioner and as supervising physician I was immediately available for consultation/collaboration.   Dagmar Hait, MD 01/20/13 [phone]
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
## 2013-01-20 NOTE — ED Notes (Signed)
NP made aware of pt temperature. Pt instructed to take motrin, and rest and drink plenty of fluids as well per provider.

## 2013-02-26 ENCOUNTER — Other Ambulatory Visit (HOSPITAL_COMMUNITY): Payer: Self-pay | Admitting: Chiropractic Medicine

## 2013-02-26 DIAGNOSIS — IMO0002 Reserved for concepts with insufficient information to code with codable children: Secondary | ICD-10-CM

## 2013-02-26 DIAGNOSIS — R52 Pain, unspecified: Secondary | ICD-10-CM

## 2013-03-07 ENCOUNTER — Ambulatory Visit (HOSPITAL_COMMUNITY)
Admission: RE | Admit: 2013-03-07 | Discharge: 2013-03-07 | Disposition: A | Discharge status: Home / Self Care | Payer: No Typology Code available for payment source | Source: Ambulatory Visit | Attending: Chiropractic Medicine | Admitting: Chiropractic Medicine

## 2013-03-07 DIAGNOSIS — M5137 Other intervertebral disc degeneration, lumbosacral region: Secondary | ICD-10-CM | POA: Diagnosis not present

## 2013-03-07 DIAGNOSIS — IMO0002 Reserved for concepts with insufficient information to code with codable children: Secondary | ICD-10-CM

## 2013-03-07 DIAGNOSIS — M51379 Other intervertebral disc degeneration, lumbosacral region without mention of lumbar back pain or lower extremity pain: Secondary | ICD-10-CM | POA: Insufficient documentation

## 2013-03-07 DIAGNOSIS — M545 Low back pain: Secondary | ICD-10-CM | POA: Diagnosis present

## 2013-03-07 DIAGNOSIS — R52 Pain, unspecified: Secondary | ICD-10-CM

## 2014-11-24 ENCOUNTER — Ambulatory Visit (INDEPENDENT_AMBULATORY_CARE_PROVIDER_SITE_OTHER): Payer: Self-pay | Admitting: Family Medicine

## 2014-11-24 ENCOUNTER — Encounter: Payer: Self-pay | Admitting: Family Medicine

## 2014-11-24 VITALS — BP 113/74 | HR 68 | Temp 98.2°F | Ht 65.0 in | Wt 125.9 lb

## 2014-11-24 DIAGNOSIS — Z1239 Encounter for other screening for malignant neoplasm of breast: Secondary | ICD-10-CM

## 2014-11-24 DIAGNOSIS — Z720 Tobacco use: Secondary | ICD-10-CM

## 2014-11-24 DIAGNOSIS — N946 Dysmenorrhea, unspecified: Secondary | ICD-10-CM

## 2014-11-24 DIAGNOSIS — F17200 Nicotine dependence, unspecified, uncomplicated: Secondary | ICD-10-CM

## 2014-11-24 DIAGNOSIS — L219 Seborrheic dermatitis, unspecified: Secondary | ICD-10-CM

## 2014-11-24 MED ORDER — NAPROXEN 500 MG PO TABS: 500.0000 mg | ORAL_TABLET | Freq: Two times a day (BID) | ORAL | Status: DC | PRN

## 2014-11-24 MED ORDER — LORATADINE 10 MG PO TABS: 10.0000 mg | ORAL_TABLET | Freq: Every day | ORAL | Status: DC

## 2014-11-24 MED ORDER — SELENIUM SULF-PYRITHIONE-UREA 2.25 % EX SHAM: MEDICATED_SHAMPOO | CUTANEOUS | Status: DC

## 2014-11-24 NOTE — Assessment & Plan Note (Signed)
Pre-contemplative. Counseling offered. Will continue to followup.

## 2014-11-24 NOTE — Patient Instructions (Signed)
Thank you for coming in today!  I sent in refills to [facility]. I'd like to see you again in 6 months to discuss general health issues and get a complete physical with labs. Use mineral oil of hydrogen peroxide in your left ear every night to loosen up the ear wax.   Our clinic's number is [phone]. Feel free to call any time with questions or concerns. We will answer any questions after hours with our 24-hour emergency line at that number as well.   - Dr. Jarvis Newcomer

## 2014-11-24 NOTE — Progress Notes (Signed)
Patient is due for a screening mammogram.  Information on imaging center and mammogram scholarship given to patient to call and schedule mammogram appointment. Altamese Dilling, BSN, RN-BC

## 2014-11-24 NOTE — Progress Notes (Signed)
Subjective: Robin Carlson is a 49 y.o. female presenting for medication refill. She was last seen Feb 2013 in the [facility] and hasn't returned due to having no money.   Has Dx allergic rhinitis. Has seasonal rhinitis and allergies. Also with cough and congestion without fever.  Takes naproxen or other NSAIDs once or twice a month for painful cramps with menstruation.  Desires a refill on shampoo she was prescribed which helped with scalp flaking.   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 Her female partner also smokes. No EtOH, no illicit drugs.  - ROS: Denies fever, headache, cough, CP, palpitations, SOB, abdominal pain, N/V/D, blood in stool, change in bladder habits, myalgias, arthralgias, and rash. No vaginal discharge, abnormal bleeding, or dysuria.  - Last pap 06/18/2009: negative.  - Last mammogram: > 2 year ago, was negative - not available in EPIC.  Objective: BP 113/74 mmHg  Pulse 68  Temp(Src) 98.2 F (36.8 C) (Oral)  Ht  (1.651 m)  Wt 125 lb 14.4 oz (57.108 kg)  BMI 20.95 kg/m2  LMP 11/15/2014 Gen: Thin, well-appearing 49 y.o. female in no distress, smelling of smoke HEENT: Normocephalic, seborrheic dermatitis scattered in scalp. Nares normal. Oropharynx clear with fair dentition. L ear canal occluded by dark brown wax. R TM and canal normal.  CV: Regular rate, no murmur; radial, DP and PT pulses 2+ bilaterally; no LE edema, no JVD, cap refill < 2 sec. Pulm: Non-labored breathing ambient air; distant but clear breath sounds without wheezes or crackles  Assessment/Plan: Robin Carlson is a 49 y.o. female here for medication refill. - Medications refilled per orders - Screening mammogram ordered, information of resources given - Return for pap smear and blood tests to include HIV, CBC, BMP, lipids

## 2014-11-24 NOTE — Assessment & Plan Note (Signed)
Still with regular periods. Will refill NSAID use prn.

## 2015-08-05 ENCOUNTER — Other Ambulatory Visit: Payer: Self-pay | Admitting: Family Medicine

## 2015-08-05 DIAGNOSIS — Z1231 Encounter for screening mammogram for malignant neoplasm of breast: Secondary | ICD-10-CM

## 2015-08-06 ENCOUNTER — Ambulatory Visit
Admission: RE | Admit: 2015-08-06 | Discharge: 2015-08-06 | Disposition: A | Discharge status: Home / Self Care | Payer: No Typology Code available for payment source | Source: Ambulatory Visit | Attending: Family Medicine | Admitting: Family Medicine

## 2015-08-06 DIAGNOSIS — Z1231 Encounter for screening mammogram for malignant neoplasm of breast: Secondary | ICD-10-CM

## 2016-01-11 ENCOUNTER — Emergency Department (HOSPITAL_COMMUNITY)
Admission: EM | Admit: 2016-01-11 | Discharge: 2016-01-11 | Disposition: A | Discharge status: Home / Self Care | Payer: No Typology Code available for payment source | Attending: Emergency Medicine | Admitting: Emergency Medicine

## 2016-01-11 ENCOUNTER — Encounter (HOSPITAL_COMMUNITY): Payer: Self-pay | Admitting: Emergency Medicine

## 2016-01-11 DIAGNOSIS — Y9389 Activity, other specified: Secondary | ICD-10-CM | POA: Diagnosis not present

## 2016-01-11 DIAGNOSIS — M25511 Pain in right shoulder: Secondary | ICD-10-CM | POA: Insufficient documentation

## 2016-01-11 DIAGNOSIS — S199XXA Unspecified injury of neck, initial encounter: Secondary | ICD-10-CM | POA: Diagnosis present

## 2016-01-11 DIAGNOSIS — Y999 Unspecified external cause status: Secondary | ICD-10-CM | POA: Diagnosis not present

## 2016-01-11 DIAGNOSIS — F17210 Nicotine dependence, cigarettes, uncomplicated: Secondary | ICD-10-CM | POA: Diagnosis not present

## 2016-01-11 DIAGNOSIS — Y92524 Gas station as the place of occurrence of the external cause: Secondary | ICD-10-CM | POA: Diagnosis not present

## 2016-01-11 DIAGNOSIS — S161XXA Strain of muscle, fascia and tendon at neck level, initial encounter: Secondary | ICD-10-CM | POA: Insufficient documentation

## 2016-01-11 MED ORDER — NAPROXEN 375 MG PO TABS: 375.0000 mg | ORAL_TABLET | Freq: Two times a day (BID) | ORAL | 0 refills | Status: DC

## 2016-01-11 MED ORDER — CYCLOBENZAPRINE HCL 10 MG PO TABS: 10.0000 mg | ORAL_TABLET | Freq: Two times a day (BID) | ORAL | 0 refills | Status: DC | PRN
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
## 2016-01-11 NOTE — ED Notes (Signed)
Pt given discharge instructions and expressed understanding.  Signature pad unable to capture signature.  Pt ambulated out of dept with a steady gait.

## 2016-01-11 NOTE — Discharge Instructions (Signed)
Please take Flexeril twice daily as needed for muscle relaxer. You may also take the naproxen twice daily for pain. Get plenty of rest and use heating pad to right neck and shoulder. Please return to ED if symptoms worsen or develop other symptoms including headache, chest pain, shortness breath, numbness or tingling, abdominal pain, nausea, vomiting. Follow up with primary care doctor symptoms fail to improve.
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
## 2016-01-13 ENCOUNTER — Encounter: Payer: Self-pay | Admitting: Family Medicine

## 2016-01-13 ENCOUNTER — Ambulatory Visit (INDEPENDENT_AMBULATORY_CARE_PROVIDER_SITE_OTHER): Payer: Self-pay | Admitting: Family Medicine

## 2016-01-13 VITALS — BP 112/68 | HR 78 | Temp 98.4°F | Ht 65.0 in | Wt 121.6 lb

## 2016-01-13 DIAGNOSIS — M25511 Pain in right shoulder: Secondary | ICD-10-CM

## 2016-01-13 DIAGNOSIS — M542 Cervicalgia: Secondary | ICD-10-CM

## 2016-01-13 MED ORDER — MELOXICAM 15 MG PO TABS: 15.0000 mg | ORAL_TABLET | Freq: Every day | ORAL | 0 refills | Status: DC

## 2016-01-13 NOTE — Patient Instructions (Addendum)
Thank you so much for coming to visit today! I have ordered an xray of your neck since you have midline tenderness over the bones. I suspect your pain is largely musculoskeletal, with tenderness over the Trapezius muscle and part of the Rotator Cuff. I recommend rest and icing the area as able. I have given you a handout with exercises to help with the rotator cuff pain. I have also sent a prescription for Mobic to the pharmacy. Please take this once daily. If your pain is not improved over the next two weeks, please let us know.  Dr. Caroleen Hamman   Rotator Cuff Injury Rotator cuff injury is any type of injury to the set of muscles and tendons that make up the stabilizing unit of your shoulder. This unit holds the ball of your upper arm bone (humerus) in the socket of your shoulder blade (scapula).  CAUSES Injuries to your rotator cuff most commonly come from sports or activities that cause your arm to be moved repeatedly over your head. Examples of this include throwing, weight lifting, swimming, or racquet sports. Long lasting (chronic) irritation of your rotator cuff can cause soreness and swelling (inflammation), bursitis, and eventual damage to your tendons, such as a tear (rupture). SIGNS AND SYMPTOMS Acute rotator cuff tear:  Sudden tearing sensation followed by severe pain shooting from your upper shoulder down your arm toward your elbow.  Decreased range of motion of your shoulder because of pain and muscle spasm.  Severe pain.  Inability to raise your arm out to the side because of pain and loss of muscle power (large tears). Chronic rotator cuff tear:  Pain that usually is worse at night and may interfere with sleep.  Gradual weakness and decreased shoulder motion as the pain worsens.  Decreased range of motion. Rotator cuff tendinitis:  Deep ache in your shoulder and the outside upper arm over your shoulder.  Pain that comes on gradually and becomes worse when lifting your arm  to the side or turning it inward. DIAGNOSIS Rotator cuff injury is diagnosed through a medical history, physical exam, and imaging exam. The medical history helps determine the type of rotator cuff injury. Your health care provider will look at your injured shoulder, feel the injured area, and ask you to move your shoulder in different positions. X-ray exams typically are done to rule out other causes of shoulder pain, such as fractures. MRI is the exam of choice for the most severe shoulder injuries because the images show muscles and tendons.  TREATMENT  Chronic tear:  Medicine for pain, such as acetaminophen or ibuprofen.  Physical therapy and range-of-motion exercises may be helpful in maintaining shoulder function and strength.  Steroid injections into your shoulder joint.  Surgical repair of the rotator cuff if the injury does not heal with noninvasive treatment. Acute tear:  Anti-inflammatory medicines such as ibuprofen and naproxen to help reduce pain and swelling.  A sling to help support your arm and rest your rotator cuff muscles. Long-term use of a sling is not advised. It may cause significant stiffening of the shoulder joint.  Surgery may be considered within a few weeks, especially in younger, active people, to return the shoulder to full function.  Indications for surgical treatment include the following:  Age younger than 60 years.  Rotator cuff tears that are complete.  Physical therapy, rest, and anti-inflammatory medicines have been used for 6-8 weeks, with no improvement.  Employment or sporting activity that requires constant shoulder use. Tendinitis:  Anti-inflammatory medicines such as ibuprofen and naproxen to help reduce pain and swelling.  A sling to help support your arm and rest your rotator cuff muscles. Long-term use of a sling is not advised. It may cause significant stiffening of the shoulder joint.  Severe tendinitis may require:  Steroid  injections into your shoulder joint.  Physical therapy.  Surgery. HOME CARE INSTRUCTIONS   Apply ice to your injury:  Put ice in a plastic bag.  Place a towel between your skin and the bag.  Leave the ice on for 20 minutes, 2-3 times a day.  If you have a shoulder immobilizer (sling and straps), wear it until told otherwise by your health care provider.  You may want to sleep on several pillows or in a recliner at night to lessen swelling and pain.  Only take over-the-counter or prescription medicines for pain, discomfort, or fever as directed by your health care provider.  Do simple hand squeezing exercises with a soft rubber ball to decrease hand swelling. SEEK MEDICAL CARE IF:   Your shoulder pain increases, or new pain or numbness develops in your arm, hand, or fingers.  Your hand or fingers are colder than your other hand. SEEK IMMEDIATE MEDICAL CARE IF:   Your arm, hand, or fingers are numb or tingling.  Your arm, hand, or fingers are increasingly swollen and painful, or they turn white or blue. MAKE SURE YOU:  Understand these instructions.  Will watch your condition.  Will get help right away if you are not doing well or get worse.   This information is not intended to replace advice given to you by your health care provider. Make sure you discuss any questions you have with your health care provider.   Document Released: 04/01/2000 Document Revised: 04/09/2013 Document Reviewed: 11/14/2012 Elsevier Interactive Patient Education [employer]2016 Elsevier Inc.

## 2016-01-14 ENCOUNTER — Ambulatory Visit: Payer: No Typology Code available for payment source

## 2016-01-22 ENCOUNTER — Ambulatory Visit (INDEPENDENT_AMBULATORY_CARE_PROVIDER_SITE_OTHER): Payer: Self-pay | Admitting: Student

## 2016-01-22 ENCOUNTER — Other Ambulatory Visit (HOSPITAL_COMMUNITY)
Admission: RE | Admit: 2016-01-22 | Discharge: 2016-01-22 | Disposition: A | Discharge status: Home / Self Care | Payer: Self-pay | Source: Ambulatory Visit | Attending: Family Medicine | Admitting: Family Medicine

## 2016-01-22 ENCOUNTER — Encounter: Payer: Self-pay | Admitting: Student

## 2016-01-22 VITALS — BP 117/72 | HR 72 | Temp 98.1°F | Ht 65.0 in | Wt 120.4 lb

## 2016-01-22 DIAGNOSIS — N898 Other specified noninflammatory disorders of vagina: Secondary | ICD-10-CM

## 2016-01-22 DIAGNOSIS — Z124 Encounter for screening for malignant neoplasm of cervix: Secondary | ICD-10-CM

## 2016-01-22 DIAGNOSIS — Z0000 Encounter for general adult medical examination without abnormal findings: Secondary | ICD-10-CM | POA: Insufficient documentation

## 2016-01-22 DIAGNOSIS — Z113 Encounter for screening for infections with a predominantly sexual mode of transmission: Secondary | ICD-10-CM | POA: Insufficient documentation

## 2016-01-22 DIAGNOSIS — Z01419 Encounter for gynecological examination (general) (routine) without abnormal findings: Secondary | ICD-10-CM | POA: Insufficient documentation

## 2016-01-22 DIAGNOSIS — Z1151 Encounter for screening for human papillomavirus (HPV): Secondary | ICD-10-CM | POA: Insufficient documentation

## 2016-01-22 LAB — POCT WET PREP (WET MOUNT)
Clue Cells Wet Prep Whiff POC: NEGATIVE
Trichomonas Wet Prep HPF POC: ABSENT

## 2016-01-22 MED ORDER — LORATADINE 10 MG PO TABS: 10.0000 mg | ORAL_TABLET | Freq: Every day | ORAL | 0 refills | Status: DC | PRN

## 2016-01-22 MED ORDER — NAPROXEN 500 MG PO TABS: 500.0000 mg | ORAL_TABLET | Freq: Two times a day (BID) | ORAL | 0 refills | Status: DC

## 2016-01-22 MED ORDER — FLUTICASONE PROPIONATE 50 MCG/ACT NA SUSP: 2.0000 | Freq: Every day | NASAL | 6 refills | Status: DC

## 2016-01-22 NOTE — Progress Notes (Signed)
Subjective:    Patient ID: Robin Carlson, female    DOB: [birth year], 50 y.o.   MRN: [id number]  HPI Patient here for annual physical. She has some concerns about chronic pain and allergy and wants to know if she can get a refill on naproxen and Claritin.   Screening Cancer Colon Cancer: mother with colon cancer at age 60.  Breast Cancer: As mammogram this year Cervical cancer: Due for Pap smear  No significant chronic disease  Infection HCV: Never been tested STI: Never been tested  Psych/Social Depression: PHQ9-3. 2 for difficulty sleeping. Denies suicidal ideation. Not on medication EtOH abuse: none Tobacco use: for 0.5 for 30 years. Planning to quit on her birthday which is coming next month. She declines nicotine patch or other medication.  Drug use: none Work: Medical illustrator. Packing  Exercise: walking everyday for 1 hour Sexual activity: one female for 10 years Birth control: BTL. LMP 12/31/2015 Intimate partner violence: safe  Immunizations  Influenza: declines  Pneumococcal: Declines  Review of Systems  Constitutional: Negative.  Negative for appetite change, fever and unexpected weight change.  HENT: Negative for dental problem, hearing loss, sore throat and trouble swallowing.   Eyes: Negative for visual disturbance.  Respiratory: Negative for cough, chest tightness, shortness of breath and wheezing.   Cardiovascular: Negative for chest pain, palpitations and leg swelling.  Gastrointestinal: Negative for abdominal pain, blood in stool and constipation.  Endocrine: Negative for cold intolerance and heat intolerance.  Genitourinary: Negative for dyspareunia, dysuria, genital sores, hematuria and vaginal bleeding.  Musculoskeletal: Negative for arthralgias, joint swelling and myalgias.  Skin: Negative for rash.  Neurological: Negative for weakness, numbness and headaches.  Hematological: Negative for adenopathy. Does not bruise/bleed easily.    Psychiatric/Behavioral: Negative for dysphoric mood and sleep disturbance. The patient is not nervous/anxious.    Objective:   Physical Exam Vitals:   01/22/16 1000  BP: 117/72  Pulse: 72  Temp: 98.1 F (36.7 C)  TempSrc: Oral  Weight: 120 lb 6.4 oz (54.6 kg)  Height: 5\' 5"  (1.651 m)    General: Alert and oriented. Pleasant and cooperative. Well-nourished and well-developed.  Head: Normocephalic and atraumatic. Eyes: Without icterus, sclera clear and conjunctiva pink.  Ears: Normal auditory acuity. TMs and ear canals normal Oropharynx: Poor dentition (some dental caries) Cardiovascular: S1, S2 present without murmurs. Extremities without edema.  Respiratory: Clear to auscultation bilaterally. No wheezes, rales, or rhonchi. No distress.  Gastrointestinal: +BS, soft, non-tender and non-distended. No HSM noted. No guarding or rebound. No masses appreciated.  Genitourinary: External genitalia appears normal. Speculum exam with abundant whitish discharge. Otherwise, vaginal and cervix appear normal. Bimanual exam negative for adnexal mass or cervical motion tenderness. Cervical vaginal swab for pap smear, wet prep, GC/CT obtained.  Musculoskalatal: Symmetrical without gross deformities. Skin: Intact without significant lesions or rashes. Neurologic: Alert and oriented x4; grossly normal. Psych: Alert and cooperative. Normal mood and affect. Heme/Lymph/Immune: No excessive bruising noted    Assessment & Plan:  Routine adult health maintenance Pap smear done today Up-to-date on her mammogram She will need a colonoscopy given family history of colon cancer (mother diagnosed with colon cancer at age 76). However, she is in the process of applying for orange card. We will order referral to GI for colonoscopy when she is approved for orange card. We will also get some blood work such as BMP, lipid panel and TSH when she is approved for orange card She declined pneumonia  and flu vaccine today She  is planning to quit smoking on her birthday which is coming. She declined nicotine patch or other medication. I gave her the quit line number.   DEPRESSIVE DISORDER, NOS Stable. PHQ9-3 was sleeping a major problem. She is already off medication

## 2016-01-22 NOTE — Assessment & Plan Note (Addendum)
Pap smear done today Up-to-date on her mammogram She will need a colonoscopy given family history of colon cancer (mother diagnosed with colon cancer at age 50). However, she is in the process of applying for orange card. We will order referral to GI for colonoscopy when she is approved for orange card. We will also get some blood work such as BMP, lipid panel and TSH when she is approved for orange card She declined pneumonia and flu vaccine today She is planning to quit smoking on her birthday which is coming. She declined nicotine patch or other medication. I gave her the quit line number.

## 2016-01-22 NOTE — Assessment & Plan Note (Signed)
Stable. PHQ9-3 was sleeping a major problem. She is already off medication

## 2016-01-22 NOTE — Patient Instructions (Addendum)
It was great seeing you today! We have addressed the following issues today  1. Smoking: I'm glad you're thinking about quitting smoking. Call 1800-QUIT-NOW for help with stopping smoking. I am happy to give you some medicine to help if you ever need. Please let me know.  2. Cervical cancer screening: We have done Pap smear today. We also testing you for infection. I will let you know when I get the results back. 3. Colon cancer screening: Your risk of colon cancer is more than the average person due to family history. Please let me know when you get your orange card so we can order colonoscopy for you.     If we did any lab work today, and the results require attention, either me or my nurse will get in touch with you. If everything is normal, you will get a letter in mail. If you don't hear from us in two weeks, please give us a call. Otherwise, I look forward to talking with you again at our next visit. If you have any questions or concerns before then, please call the clinic at [phone].  Please bring all your medications to every doctors visit   Sign up for My Chart to have easy access to your labs results, and communication with your Primary care physician.    Please check-out at the front desk before leaving the clinic.   Take Care,

## 2016-01-25 ENCOUNTER — Encounter: Payer: Self-pay | Admitting: Student

## 2016-01-25 LAB — CYTOLOGY - PAP

## 2016-01-25 LAB — CERVICOVAGINAL ANCILLARY ONLY
CHLAMYDIA, DNA PROBE: NEGATIVE
Neisseria Gonorrhea: NEGATIVE

## 2016-01-25 NOTE — Progress Notes (Signed)
Wet prep, GC/CT and Pap normal. Sent letter to patient

## 2016-01-26 ENCOUNTER — Ambulatory Visit: Payer: Self-pay | Attending: Internal Medicine

## 2016-05-09 ENCOUNTER — Other Ambulatory Visit: Payer: Self-pay | Admitting: *Deleted

## 2016-05-09 MED ORDER — FLUTICASONE PROPIONATE 50 MCG/ACT NA SUSP: 2.0000 | Freq: Every day | NASAL | 6 refills | Status: DC

## 2016-05-09 MED ORDER — LORATADINE 10 MG PO TABS: 10.0000 mg | ORAL_TABLET | Freq: Every day | ORAL | 0 refills | Status: DC | PRN

## 2016-05-09 MED ORDER — NAPROXEN 500 MG PO TABS: 500.0000 mg | ORAL_TABLET | Freq: Two times a day (BID) | ORAL | 0 refills | Status: DC

## 2016-10-13 ENCOUNTER — Other Ambulatory Visit: Payer: Self-pay | Admitting: Student

## 2016-10-13 MED ORDER — FLUTICASONE PROPIONATE 50 MCG/ACT NA SUSP: 2.0000 | Freq: Every day | NASAL | 6 refills | Status: DC

## 2016-10-13 MED ORDER — LORATADINE 10 MG PO TABS: 10.0000 mg | ORAL_TABLET | Freq: Every day | ORAL | 0 refills | Status: DC | PRN

## 2016-10-13 NOTE — Telephone Encounter (Signed)
Pt calling to request refill of:  Name of Medication(s):  Claritin (with refills) and nasal spray Last date of OV: 01-22-16 Pharmacy:  [facility]  Will route refill request to Clinic RN.  Discussed with patient policy to call pharmacy for future refills.  Also, discussed refills may take up to 48 hours to approve or deny.  Markus JarvisEmily C Pittman

## 2016-12-09 ENCOUNTER — Other Ambulatory Visit: Payer: Self-pay | Admitting: Student

## 2017-01-14 ENCOUNTER — Other Ambulatory Visit: Payer: Self-pay | Admitting: Student

## 2017-01-31 ENCOUNTER — Telehealth: Payer: Self-pay | Admitting: *Deleted

## 2017-01-31 ENCOUNTER — Other Ambulatory Visit: Payer: Self-pay | Admitting: Student

## 2017-01-31 NOTE — Telephone Encounter (Signed)
Pt calling in wanting refill on shampoo, selenium? Please advise. Deseree Bruna Potter, CMA

## 2017-02-07 NOTE — Telephone Encounter (Signed)
Attempted to call patient twice. Selenium shampoo is available over-the-counter. Please let the patient know if she calls back

## 2017-03-03 ENCOUNTER — Ambulatory Visit
Admission: RE | Admit: 2017-03-03 | Discharge: 2017-03-03 | Disposition: A | Discharge status: Home / Self Care | Payer: BLUE CROSS/BLUE SHIELD | Source: Ambulatory Visit | Attending: Specialist | Admitting: Specialist

## 2017-03-03 ENCOUNTER — Other Ambulatory Visit: Payer: Self-pay | Admitting: Specialist

## 2017-03-03 DIAGNOSIS — M25551 Pain in right hip: Secondary | ICD-10-CM

## 2017-03-03 DIAGNOSIS — M545 Low back pain: Secondary | ICD-10-CM

## 2017-05-04 DIAGNOSIS — L658 Other specified nonscarring hair loss: Secondary | ICD-10-CM | POA: Insufficient documentation

## 2017-10-09 ENCOUNTER — Other Ambulatory Visit: Payer: Self-pay | Admitting: Student

## 2017-10-23 ENCOUNTER — Other Ambulatory Visit: Payer: Self-pay | Admitting: Family Medicine

## 2018-02-23 ENCOUNTER — Encounter: Payer: Self-pay | Admitting: Podiatry

## 2018-02-23 ENCOUNTER — Ambulatory Visit (INDEPENDENT_AMBULATORY_CARE_PROVIDER_SITE_OTHER): Payer: BLUE CROSS/BLUE SHIELD

## 2018-02-23 ENCOUNTER — Ambulatory Visit (INDEPENDENT_AMBULATORY_CARE_PROVIDER_SITE_OTHER): Payer: BLUE CROSS/BLUE SHIELD | Admitting: Podiatry

## 2018-02-23 DIAGNOSIS — M779 Enthesopathy, unspecified: Secondary | ICD-10-CM | POA: Diagnosis not present

## 2018-02-23 DIAGNOSIS — M21619 Bunion of unspecified foot: Secondary | ICD-10-CM

## 2018-02-23 DIAGNOSIS — M216X9 Other acquired deformities of unspecified foot: Secondary | ICD-10-CM

## 2018-02-23 DIAGNOSIS — M722 Plantar fascial fibromatosis: Secondary | ICD-10-CM

## 2018-02-23 DIAGNOSIS — L84 Corns and callosities: Secondary | ICD-10-CM

## 2018-02-23 MED ORDER — DICLOFENAC SODIUM 1 % TD GEL: 2.0000 g | Freq: Four times a day (QID) | TRANSDERMAL | 2 refills | Status: AC

## 2018-02-23 NOTE — Progress Notes (Signed)
Subjective:    Patient ID: Robin Carlson, female    DOB: [birth year], 51 y.o.   MRN: [id number]  HPI 52 year old female presents the office today for concerns of pain to both of her feet with the left side worse than the right.  She has a bunion on both sides is been ongoing for some time but over the last 6 months has been getting more uncomfortable.  She also states this been hurting when shoes.  She also gets calluses to the balls of her feet which are painful at times.  She states that other family members have bunions.  She started to get some numbness on the bunion site on the left side.  She is also concerned about discoloration to her toenails but denies any pain or swelling or any redness or drainage.  She denies any recent injury or trauma.  She has no other concerns today.   Review of Systems  All other systems reviewed and are negative.  History reviewed. No pertinent past medical history.  Past Surgical History:  Procedure Laterality Date  . CESAREAN SECTION       Current Outpatient Medications:  .  NON FORMULARY, , Disp: , Rfl:  .  NON FORMULARY, [facility]  Onychomycosis Nail Lacquer -  Fluconazole 2%, Terbinafine 1% DMSO/undecylenic acid 25% Apply to affected nail once daily Qty. 120 gm 3 refills, Disp: , Rfl:  .  diclofenac sodium (VOLTAREN) 1 % GEL, Apply 2 g topically 4 (four) times daily. Rub into affected area of foot 2 to 4 times daily, Disp: 100 g, Rfl: 2 .  fluticasone (FLONASE) 50 MCG/ACT nasal spray, SPRAY 2 SPRAYS INTO EACH NOSTRIL EVERY DAY, Disp: 16 g, Rfl: 1 .  naproxen (NAPROSYN) 500 MG tablet, TAKE ONE TABLET BY MOUTH TWICE DAILY WITH  A  MEAL., Disp: 30 tablet, Rfl: 0  No Known Allergies      Objective:   Physical Exam  General: AAO x3, NAD  Dermatological: Nails are mildly dystrophic, discolored with yellow to brown discoloration.  No open lesions are identified.  Mild hyperkeratotic lesion submetatarsal 2 through 4  bilaterally.  Vascular: Dorsalis Pedis artery and Posterior Tibial artery pedal pulses are 2/4 bilateral with immedate capillary fill time. There is no pain with calf compression, swelling, warmth, erythema.   Neruologic: Grossly intact via light touch bilateral. Protective threshold with Semmes Wienstein monofilament intact to all pedal sites bilateral.  Subjectively she is having some numbness stroke on the bunion site and this appears to be over the nerve compression of the bunion.  Musculoskeletal: Bunion deformities present bilaterally.  There is erythema on the bunion site and irritation inside the shoes but there is no increase in warmth or skin breakdown.  Mild decrease in medial arch upon weightbearing.  There is no pain or crepitation first MPJ range of motion.  Mild tenderness palpation to the bunion sites but no other areas of tenderness.  Prominent metatarsal heads.  She also gets some discomfort in the arch of the foot at times especially after being on her feet all day.  There is no pain on the plantar fascia today.  Muscular strength 5/5 in all groups tested bilateral.  Gait: Unassisted, Nonantalgic.      Assessment & Plan:  52 year old female bunion deformity, prominent metatarsal heads bilaterally; plantar fasciitis -Treatment options discussed including all alternatives, risks, and complications -Etiology of symptoms were discussed -X-rays were obtained and reviewed with the patient.  Bunion deformities present.  There  is no evidence of acute fracture identified today. -Regards to the bunion we discussed both conservative as well as surgical treatment options.  This time she is to continue conservative treatment.  I prescribed Voltaren gel.  We discussed shoe modifications and orthotics.  Given her arch pain, bunion deformity and her job working to try to get her custom inserts.  When check insurance coverage we did mold her for this today. -Regards to the nail fungus I ordered a  topical antifungal through [employer].   Vivi Barrack DPM

## 2018-03-23 ENCOUNTER — Encounter: Payer: BLUE CROSS/BLUE SHIELD | Admitting: Podiatry

## 2019-03-26 ENCOUNTER — Other Ambulatory Visit: Payer: Self-pay | Admitting: Nurse Practitioner

## 2019-03-26 DIAGNOSIS — Z1231 Encounter for screening mammogram for malignant neoplasm of breast: Secondary | ICD-10-CM

## 2020-01-01 ENCOUNTER — Other Ambulatory Visit: Payer: Self-pay

## 2020-01-01 ENCOUNTER — Ambulatory Visit
Admission: RE | Admit: 2020-01-01 | Discharge: 2020-01-01 | Disposition: A | Discharge status: Home / Self Care | Payer: BLUE CROSS/BLUE SHIELD | Source: Ambulatory Visit | Attending: Nurse Practitioner | Admitting: Nurse Practitioner

## 2020-01-01 DIAGNOSIS — Z1231 Encounter for screening mammogram for malignant neoplasm of breast: Secondary | ICD-10-CM

## 2020-12-31 ENCOUNTER — Other Ambulatory Visit: Payer: Self-pay | Admitting: Nurse Practitioner

## 2020-12-31 ENCOUNTER — Other Ambulatory Visit: Payer: Self-pay

## 2020-12-31 DIAGNOSIS — Z1231 Encounter for screening mammogram for malignant neoplasm of breast: Secondary | ICD-10-CM

## 2021-03-16 ENCOUNTER — Other Ambulatory Visit: Payer: Self-pay

## 2021-03-16 ENCOUNTER — Ambulatory Visit
Admission: RE | Admit: 2021-03-16 | Discharge: 2021-03-16 | Disposition: A | Discharge status: Home / Self Care | Payer: BC Managed Care – PPO | Source: Ambulatory Visit | Attending: Nurse Practitioner | Admitting: Nurse Practitioner

## 2021-03-16 DIAGNOSIS — Z1231 Encounter for screening mammogram for malignant neoplasm of breast: Secondary | ICD-10-CM

## 2022-04-08 ENCOUNTER — Other Ambulatory Visit: Payer: Self-pay | Admitting: Nurse Practitioner

## 2022-04-08 DIAGNOSIS — Z1231 Encounter for screening mammogram for malignant neoplasm of breast: Secondary | ICD-10-CM

## 2022-04-15 ENCOUNTER — Ambulatory Visit
Admission: RE | Admit: 2022-04-15 | Discharge: 2022-04-15 | Disposition: A | Discharge status: Home / Self Care | Payer: BC Managed Care – PPO | Source: Ambulatory Visit | Attending: Nurse Practitioner | Admitting: Nurse Practitioner

## 2022-04-15 DIAGNOSIS — Z1231 Encounter for screening mammogram for malignant neoplasm of breast: Secondary | ICD-10-CM

## 2022-06-26 IMAGING — MG MM DIGITAL SCREENING BILAT W/ TOMO AND CAD
6 of 10 series · 6 of 30 positions shown · non-contrast
Comparison: Previous exam(s).

CLINICAL DATA: Screening.

EXAM:
DIGITAL SCREENING BILATERAL MAMMOGRAM WITH TOMOSYNTHESIS AND CAD
TECHNIQUE: Bilateral screening digital craniocaudal and mediolateral oblique
mammograms were obtained. Bilateral screening digital breast
tomosynthesis was performed. The images were evaluated with
computer-aided detection.

[L CC synth-2D]
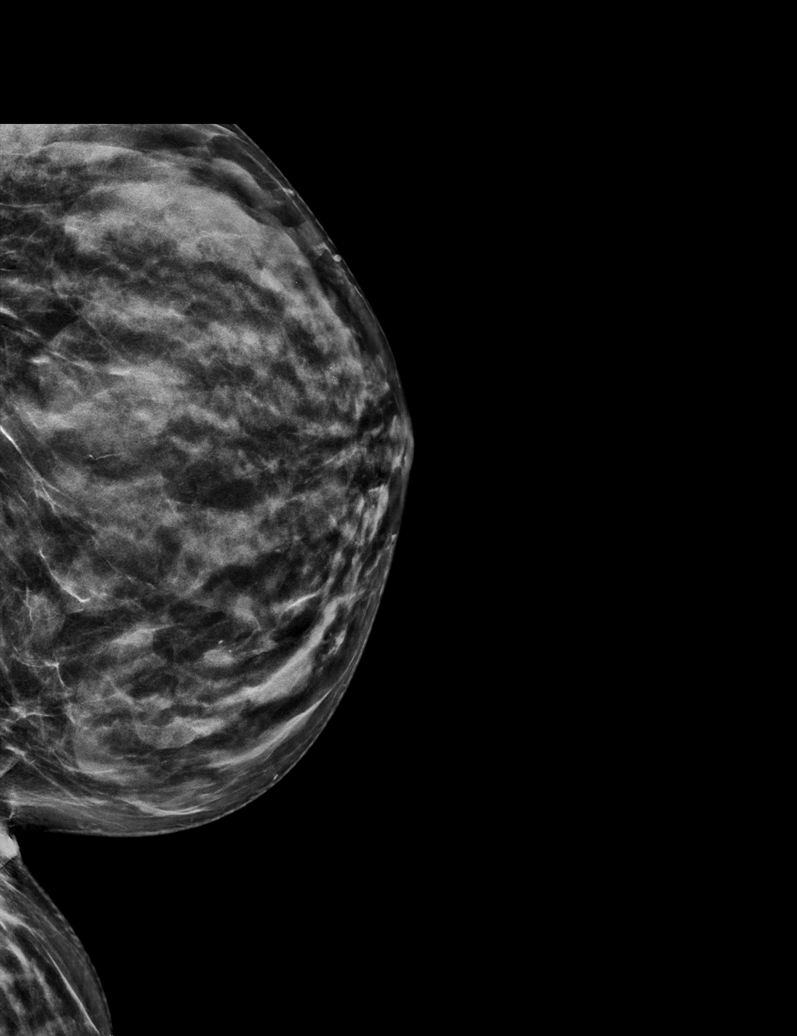

[R CC synth-2D]
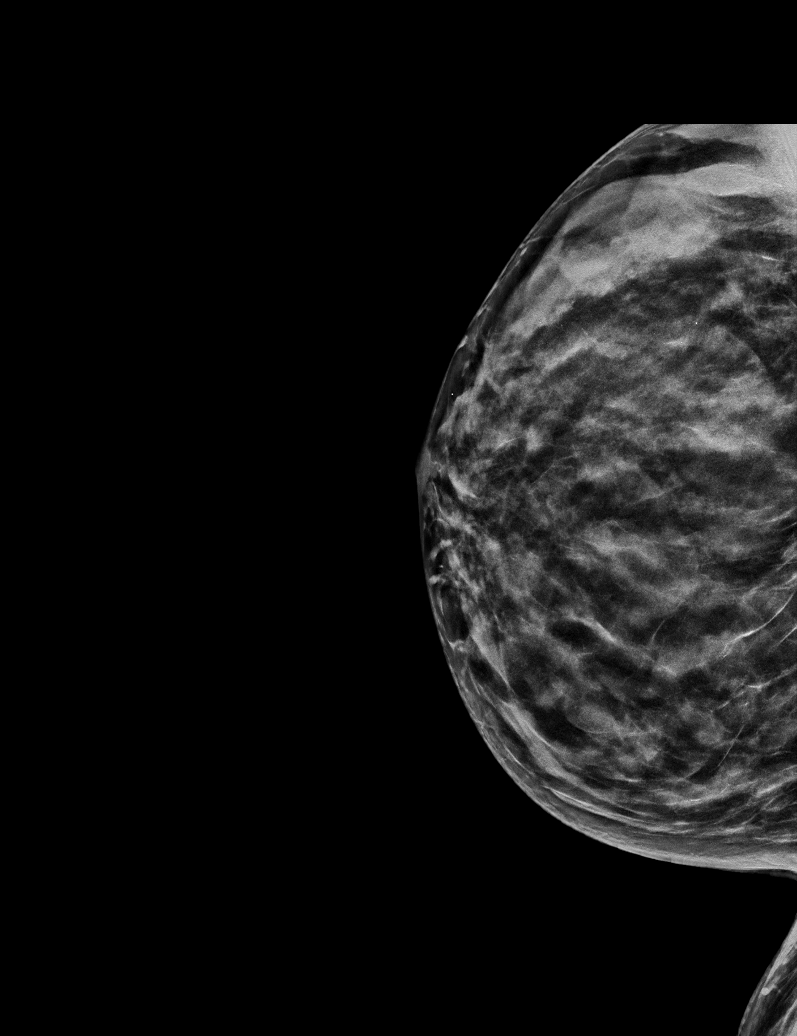

[R XCCL synth-2D]
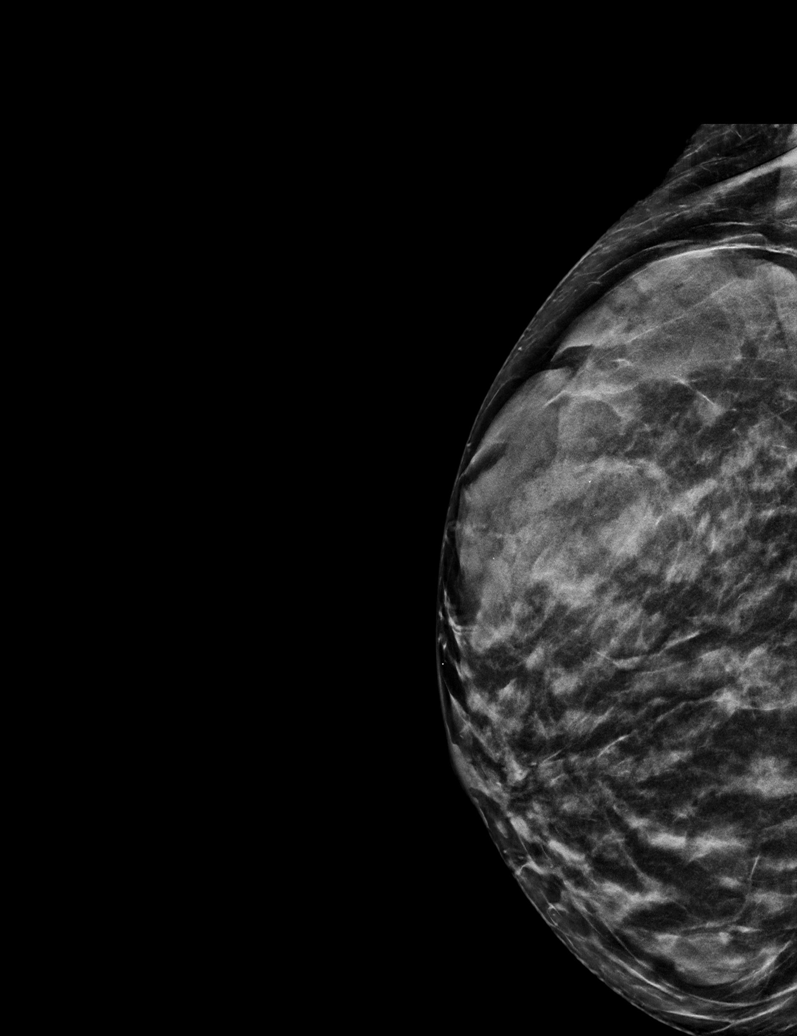

[R MLO synth-2D]
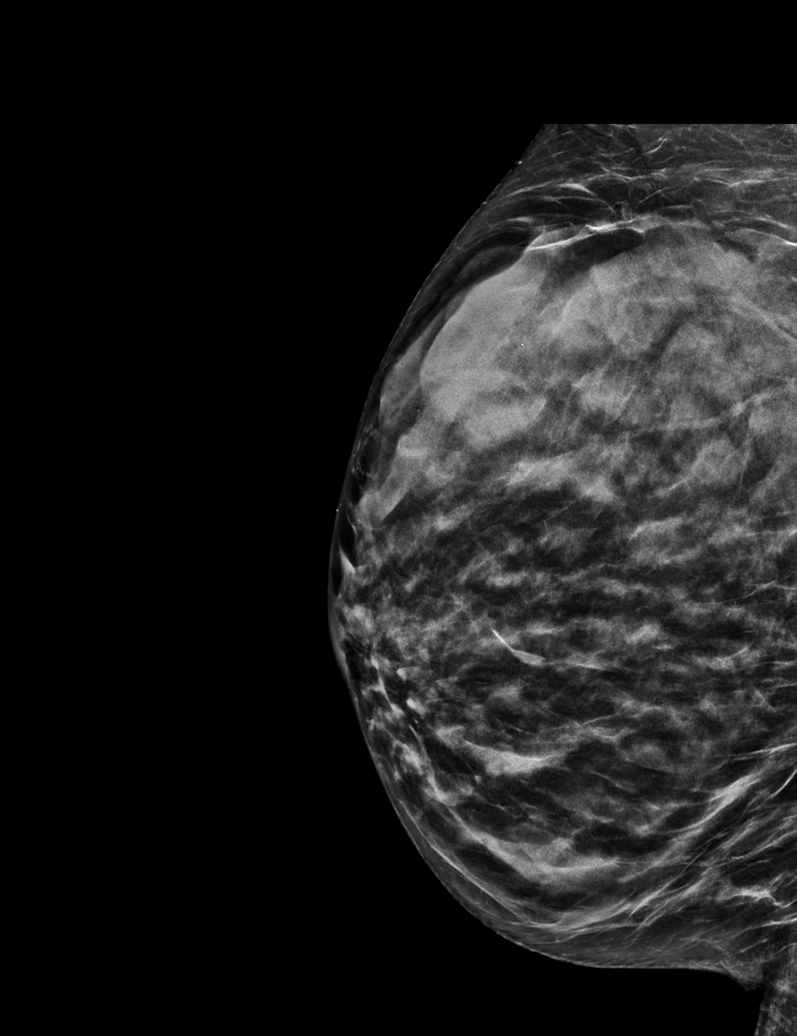

[L MLO synth-2D]
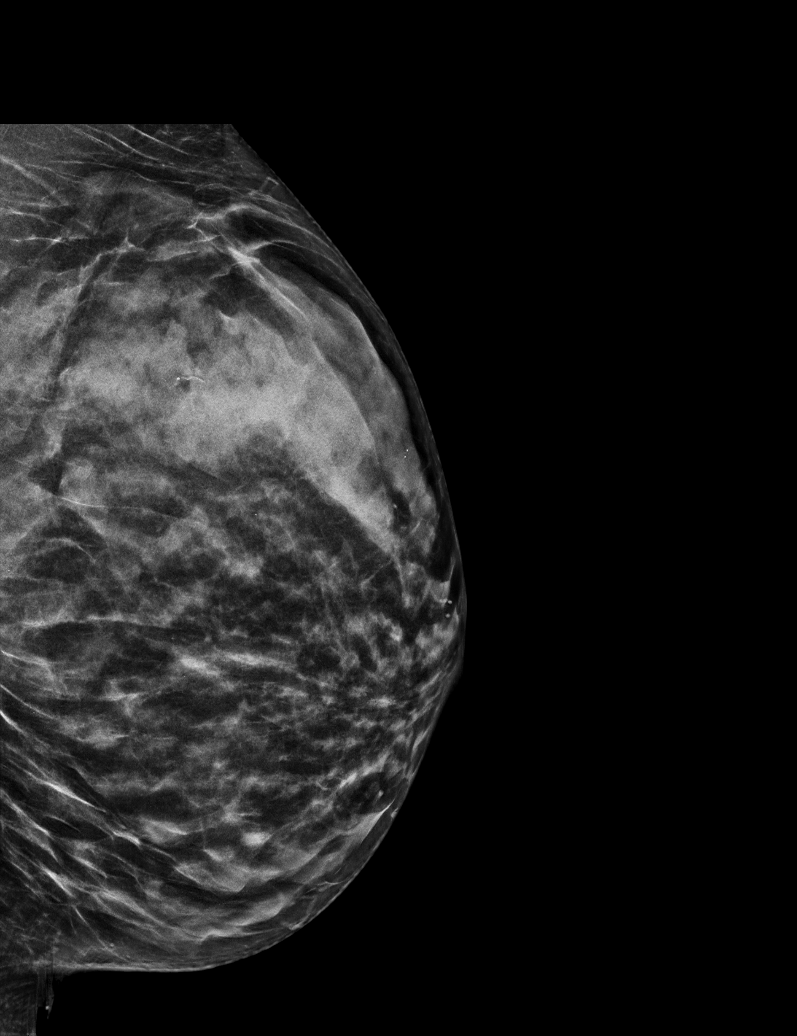

[R XCCL tomo · tomo slice 25/50.0]
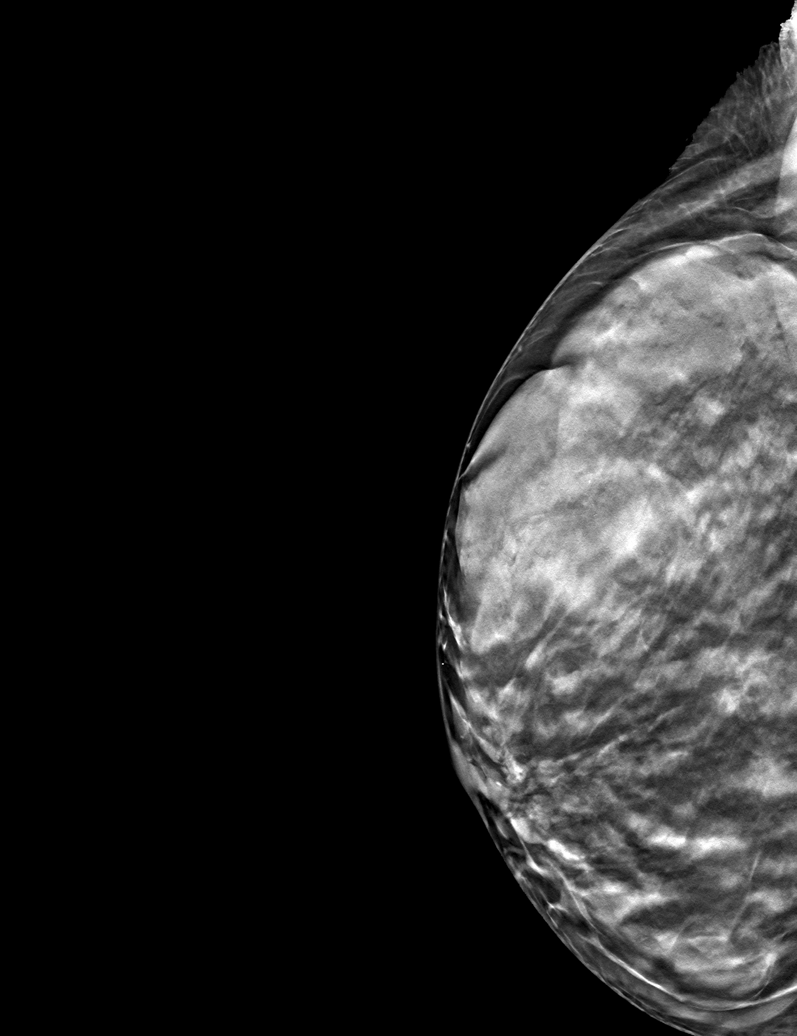

[6 of 30 positions shown; findings below may reference images not displayed]

ACR Breast Density Category d: The breast tissue is extremely dense,
which lowers the sensitivity of mammography
FINDINGS: There are no findings suspicious for malignancy.
IMPRESSION: No mammographic evidence of malignancy. A result letter of this
screening mammogram will be mailed directly to the patient.

RECOMMENDATION:
Screening mammogram in one year. (Code:TA-V-WV9)

BI-RADS CATEGORY  1: Negative.

## 2022-11-21 ENCOUNTER — Ambulatory Visit: Payer: BC Managed Care – PPO | Admitting: Podiatry

## 2023-03-03 ENCOUNTER — Other Ambulatory Visit (INDEPENDENT_AMBULATORY_CARE_PROVIDER_SITE_OTHER): Payer: BC Managed Care – PPO

## 2023-03-03 ENCOUNTER — Ambulatory Visit (INDEPENDENT_AMBULATORY_CARE_PROVIDER_SITE_OTHER): Payer: BC Managed Care – PPO | Admitting: Orthopedic Surgery

## 2023-03-03 ENCOUNTER — Encounter: Payer: Self-pay | Admitting: Orthopedic Surgery

## 2023-03-03 VITALS — BP 120/71 | HR 60 | Ht 65.0 in | Wt 131.0 lb

## 2023-03-03 DIAGNOSIS — M545 Low back pain, unspecified: Secondary | ICD-10-CM | POA: Diagnosis not present

## 2023-03-03 DIAGNOSIS — Z720 Tobacco use: Secondary | ICD-10-CM | POA: Diagnosis not present

## 2023-03-03 DIAGNOSIS — M5416 Radiculopathy, lumbar region: Secondary | ICD-10-CM | POA: Diagnosis not present

## 2023-03-03 NOTE — Progress Notes (Signed)
Orthopedic Spine Surgery Office Note  Assessment: Patient is a 57 y.o. female with bilateral leg pain.  On the right, has pain in her right buttock.  On the left, has pain radiating down the lateral aspect of her extremity.  Possible radiculopathy   Plan: -Explained that initially conservative treatment is tried as a significant number of patients may experience relief with these treatment modalities. Discussed that the conservative treatments include:  -activity modification  -physical therapy  -over the counter pain medications  -medrol dosepak  -lumbar steroid injections -Patient has tried PT, gabapentin, Tylenol, muscle relaxer, hydrocodone, pain management -Since she has tried conservative treatments for over 3 years now without any lasting relief, recommended MRI of the lumbar spine to evaluate for radiculopathy -She would need to be nicotine free prior to any elective spine surgery -She has gotten some relief to help her sleep at night so I told her she can continue to take that. She can also use 1000mg  of tylenol up to three times per day -Patient should return to office in 4 weeks, x-rays at next visit: none   I spent 5 minutes covering the risks of nicotine use with surgery and the rationale for having her quit prior to any elective surgery.  She said she is working on quitting and is down to 2 packs/week.  Patient expressed understanding of the plan and all questions were answered to the patient's satisfaction.   ___________________________________________________________________________   History:  Patient is a 57 y.o. female who presents today for lumbar spine.  Patient has had 3 years of bilateral leg pain.  On the right, she feels it in the right buttock.  On the left leg, she feels it starts in the left buttock and goes into the left lateral thigh and leg.  It goes to the level of the ankle.  She states that the left leg is more significant in terms of pain on the right.   She feels pain on a daily basis.  She is currently in pain management but feels that the pain has gotten worse within the last 2 months and their treatments are no longer biting her with significant relief.  There is no trauma or injury that preceded the onset of her pain.   Weakness: Denies Symptoms of imbalance: Denies Paresthesias and numbness: Denies Bowel or bladder incontinence: Denies Saddle anesthesia: Denies  Treatments tried: PT, gabapentin, Tylenol, muscle relaxer, hydrocodone, pain management  Review of systems: Denies fevers and chills, night sweats, unexplained weight loss, history of cancer, pain that wakes them at night  Past medical history: Depression Allergic rhinitis  Allergies: NKDA  Past surgical history:  C section  Social history: Reports use of nicotine product (smoking, vaping, patches, smokeless) Alcohol use: denies Denies recreational drug use   Physical Exam:  BMI of 21.8  General: no acute distress, appears stated age Neurologic: alert, answering questions appropriately, following commands Respiratory: unlabored breathing on room air, symmetric chest rise Psychiatric: appropriate affect, normal cadence to speech   MSK (spine):  -Strength exam      Left  Right EHL    5/5  5/5 TA    5/5  5/5 GSC    5/5  5/5 Knee extension  5/5  5/5 Hip flexion   5/5  5/5  -Sensory exam    Sensation intact to light touch in L3-S1 nerve distributions of bilateral lower extremities  -Achilles DTR: 2/4 on the left, 2/4 on the right -Patellar tendon DTR: 2/4 on the left,  2/4 on the right  -Straight leg raise: Negative bilaterally -Femoral nerve stretch test: Negative bilaterally -Clonus: no beats bilaterally  -Left hip exam: No pain through range of motion, negative Stinchfield, negative FABER -Right hip exam: No pain through range of motion, negative Stinchfield, negative FABER  Imaging: XRs of the lumbar spine from 03/03/2023 was independently  reviewed and interpreted, showing vacuum disc phenomenon at L5/S1.  Lumbar scoliosis with apex to the left at L2/3. Lateral listhesis at L3/4. Disc height loss at L4/5 and L5/S1. No fracture or dislocation seen.  No evidence of instability on flexion/extension views.   Patient name: Robin Carlson Patient MRN: [id number] Date of visit: 03/03/23

## 2023-03-14 ENCOUNTER — Ambulatory Visit: Payer: BC Managed Care – PPO | Admitting: Orthopaedic Surgery

## 2023-03-24 ENCOUNTER — Encounter: Payer: Self-pay | Admitting: Orthopedic Surgery

## 2023-03-28 ENCOUNTER — Ambulatory Visit
Admission: RE | Admit: 2023-03-28 | Discharge: 2023-03-28 | Disposition: A | Discharge status: Home / Self Care | Payer: BC Managed Care – PPO | Source: Ambulatory Visit | Attending: Orthopedic Surgery | Admitting: Orthopedic Surgery

## 2023-03-28 DIAGNOSIS — M5416 Radiculopathy, lumbar region: Secondary | ICD-10-CM

## 2023-03-31 ENCOUNTER — Ambulatory Visit (INDEPENDENT_AMBULATORY_CARE_PROVIDER_SITE_OTHER): Payer: BC Managed Care – PPO | Admitting: Orthopedic Surgery

## 2023-03-31 DIAGNOSIS — M5416 Radiculopathy, lumbar region: Secondary | ICD-10-CM | POA: Diagnosis not present

## 2023-03-31 NOTE — Progress Notes (Signed)
Orthopedic Spine Surgery Office Note   Assessment: Patient is a 57 y.o. female with bilateral leg pain.  On the right, has pain in her right buttock.  On the left, has pain radiating down the lateral aspect of her extremity.  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  Patient has a fear of needles and did not want to proceed with that treatment.  She said she will call me if after she thinks about it some more -She is currently not a candidate for elective spine surgery as she is still using nicotine -Patient should return to office on an as-needed basis    Patient expressed understanding of the plan and all questions were answered to the patient's satisfaction.    ___________________________________________________________________________     History:   Patient is a 57 y.o. female who presents today for follow-up on her lumbar spine.  Patient was previously seen in the office with 3 years of back pain that radiates into her bilateral lower extremities.  On the right side, she feels goes in the right buttock.  On the left, she feels it goes into the left buttock and into the left lateral thigh and leg.  She has not noticed any change in her symptoms since she was last seen.  She has not developed any new symptoms.   Still actively using nicotine.   Treatments tried: PT, gabapentin, Tylenol, muscle relaxer, hydrocodone, pain management     Physical Exam:   General: no acute distress, appears stated age Neurologic: alert, answering questions appropriately, following commands Respiratory: unlabored breathing on room air, symmetric chest rise Psychiatric: appropriate affect, normal cadence to speech     MSK (spine):   -Strength exam                                                    Left                  Right EHL                              5/5                  5/5 TA                                 5/5                  5/5 GSC                             5/5                  5/5 Knee extension            5/5                  5/5 Hip flexion                    5/5  5/5   -Sensory exam                           Sensation intact to light touch in L3-S1 nerve distributions of bilateral lower extremities   -Achilles DTR: 2/4 on the left, 2/4 on the right -Patellar tendon DTR: 2/4 on the left, 2/4 on the right   Imaging: XRs of the lumbar spine from 03/03/2023 were previously independently reviewed and interpreted, showing vacuum disc phenomenon at L5/S1.  Lumbar scoliosis with apex to the left at L2/3. Lateral listhesis at L3/4. Disc height loss at L4/5 and L5/S1. No fracture or dislocation seen.  No evidence of instability on flexion/extension views.  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  Foraminal stenosis bilaterally at L5/S1.  No other significant stenosis seen.     Patient name: Robin Carlson Patient MRN: [id number] Date of visit: 03/31/23

## 2023-04-27 ENCOUNTER — Other Ambulatory Visit: Payer: Self-pay | Admitting: Nurse Practitioner

## 2023-04-27 DIAGNOSIS — Z1231 Encounter for screening mammogram for malignant neoplasm of breast: Secondary | ICD-10-CM

## 2023-05-17 ENCOUNTER — Ambulatory Visit: Payer: BC Managed Care – PPO

## 2023-05-31 ENCOUNTER — Ambulatory Visit
Admission: RE | Admit: 2023-05-31 | Discharge: 2023-05-31 | Disposition: A | Discharge status: Home / Self Care | Payer: BC Managed Care – PPO | Source: Ambulatory Visit | Attending: Nurse Practitioner | Admitting: Nurse Practitioner

## 2023-05-31 DIAGNOSIS — Z1231 Encounter for screening mammogram for malignant neoplasm of breast: Secondary | ICD-10-CM

## 2023-06-01 ENCOUNTER — Telehealth: Payer: Self-pay | Admitting: Orthopedic Surgery

## 2023-06-01 DIAGNOSIS — M5416 Radiculopathy, lumbar region: Secondary | ICD-10-CM

## 2023-06-01 NOTE — Telephone Encounter (Signed)
Patient called advised she would like to get the cortisone injection in her back. The number to contact patient is [phone]

## 2023-06-14 ENCOUNTER — Ambulatory Visit (INDEPENDENT_AMBULATORY_CARE_PROVIDER_SITE_OTHER): Payer: BC Managed Care – PPO | Admitting: Physical Medicine and Rehabilitation

## 2023-06-14 ENCOUNTER — Other Ambulatory Visit: Payer: Self-pay

## 2023-06-14 VITALS — BP 120/76 | HR 80

## 2023-06-14 DIAGNOSIS — M5416 Radiculopathy, lumbar region: Secondary | ICD-10-CM

## 2023-06-14 MED ORDER — METHYLPREDNISOLONE ACETATE 40 MG/ML IJ SUSP
40.0000 mg | Freq: Once | INTRAMUSCULAR | Status: AC
  Administered 2023-06-14: 40 mg

## 2023-06-14 NOTE — Patient Instructions (Signed)

## 2023-06-14 NOTE — Progress Notes (Signed)
 Robin Carlson - 58 y.o. female MRN [id number]  Date of birth: 25-Aug-1965  Office Visit Note: Visit Date: 06/14/2023 PCP: Pcp, No Referred by: London Sheer, MD  Subjective: Chief Complaint  Patient presents with   Lower Back - Pain   HPI:  Robin Carlson is a 58 y.o. female who comes in today at the request of Dr. Willia Craze for planned Left L4-5 Lumbar Transforaminal epidural steroid injection with fluoroscopic guidance.  The patient has failed conservative care including home exercise, medications, time and activity modification.  This injection will be diagnostic and hopefully therapeutic.  Please see requesting physician notes for further details and justification.   ROS Otherwise per HPI.  Assessment & Plan: Visit Diagnoses:    ICD-10-CM   1. Lumbar radiculopathy  M54.16 methylPREDNISolone acetate (DEPO-MEDROL) injection 40 mg    XR C-ARM NO REPORT    Epidural Steroid injection      Plan: No additional findings.   Meds & Orders:  Meds ordered this encounter  Medications   methylPREDNISolone acetate (DEPO-MEDROL) injection 40 mg    Orders Placed This Encounter  Procedures   XR C-ARM NO REPORT   Epidural Steroid injection    Follow-up: Return for visit to requesting provider as needed.   Procedures: No procedures performed  Lumbosacral Transforaminal Epidural Steroid Injection - Sub-Pedicular Approach with Fluoroscopic Guidance  Patient: Robin Carlson      Date of Birth: 03-14-1966 MRN: [id number] PCP: Pcp, No      Visit Date: 06/14/2023   Universal Protocol:    Date/Time: 06/14/2023  Consent Given By: the patient  Position: PRONE  Additional Comments: Vital signs were monitored before and after the procedure. Patient was prepped and draped in the usual sterile fashion. The correct patient, procedure, and site was verified.   Injection Procedure Details:   Procedure diagnoses: Lumbar radiculopathy [M54.16]    Meds Administered:  Meds  ordered this encounter  Medications   methylPREDNISolone acetate (DEPO-MEDROL) injection 40 mg    Laterality: Left  Location/Site: L4  Needle:5.0 in., 22 ga.  Short bevel or Quincke spinal needle  Needle Placement: Transforaminal  Findings:    -Comments: Excellent flow of contrast along the nerve, nerve root and into the epidural space. Patient was very nervous with pain response just as foramen. Medication was delivered adequately but she would benefit from oral or IV sedation for any future injection.  Procedure Details: After squaring off the end-plates to get a true AP view, the C-arm was positioned so that an oblique view of the foramen as noted above was visualized. The target area is just inferior to the "nose of the scotty dog" or sub pedicular. The soft tissues overlying this structure were infiltrated with 2-3 ml. of 1% Lidocaine without Epinephrine.  The spinal needle was inserted toward the target using a "trajectory" view along the fluoroscope beam.  Under AP and lateral visualization, the needle was advanced so it did not puncture dura and was located close the 6 O'Clock position of the pedical in AP tracterory. Biplanar projections were used to confirm position. Aspiration was confirmed to be negative for CSF and/or blood. A 1-2 ml. volume of Isovue-250 was injected and flow of contrast was noted at each level. Radiographs were obtained for documentation purposes.   After attaining the desired flow of contrast documented above, a 0.5 to 1.0 ml test dose of 0.25% Marcaine was injected into each respective transforaminal space.  The patient was observed for 90  seconds post injection.  After no sensory deficits were reported, and normal lower extremity motor function was noted,   the above injectate was administered so that equal amounts of the injectate were placed at each foramen (level) into the transforaminal epidural space.   Additional Comments:  No complications  occurred Dressing: 2 x 2 sterile gauze and Band-Aid    Post-procedure details: Patient was observed during the procedure. Post-procedure instructions were reviewed.  Patient left the clinic in stable condition.    Clinical History: MRI LUMBAR SPINE WITHOUT CONTRAST   TECHNIQUE: Multiplanar, multisequence MR imaging of the lumbar spine was performed. No intravenous contrast was administered.   COMPARISON:  Remote MRI from 2014. Lumbar spine radiographs 03/03/2023   FINDINGS: Segmentation: There are five lumbar type vertebral bodies. The last full intervertebral disc space is labeled L5-S1.   Alignment: Scoliosis but normal overall alignment in the sagittal plane.   Vertebrae:  Normal marrow signal.  No bone lesions fractures.   Conus medullaris and cauda equina: Conus extends to the L1 level. Conus and cauda equina appear normal.   Paraspinal and other soft tissues: No significant paraspinal or retroperitoneal findings.   Disc levels:   T12-L1: No significant findings.   L1-2: No significant findings.   L2-3: Mild diffuse annular bulge and mild facet disease with mild bilateral lateral recess encroachment. No spinal or foraminal stenosis.   L3-4: Mild bulging annulus and mild facet disease with mild bilateral lateral recess stenosis. There is also a right foraminal and extraforaminal disc osteophyte complex without direct neural compression but potential irritation of the right L3 nerve root extra foraminally.   L4-5: Degenerative disc disease and facet disease with a bulging annulus and mild osteophytic ridging. Slight flattening of the ventral thecal sac but no significant spinal or lateral recess stenosis. There is a left-sided disc osteophyte complex with mild left foraminal narrowing and potential irritation of the left L4 nerve root.   L5-S1: Moderate disc disease and facet disease no disc protrusions, spinal or foraminal stenosis. Mild foraminal  encroachment noted on the right.   IMPRESSION: 1. Mild bilateral lateral recess encroachment at L2-3. 2. Right foraminal and extraforaminal disc osteophyte complex at L3-4 without direct neural compression but potential irritation of the right L3 nerve root extra foraminally. 3. Left-sided disc osteophyte complex at L4-5 with mild left foraminal narrowing and potential irritation of the left L4 nerve root. 4. Mild foraminal encroachment on the right at L5-S1.     Electronically Signed   By: Rudie Meyer M.D.   On: 03/31/2023 10:01     Objective:  VS:  HT:    WT:   BMI:     BP:120/76  HR:80bpm  TEMP: ( )  RESP:  Physical Exam Vitals and nursing note reviewed.  Constitutional:      General: She is not in acute distress.    Appearance: Normal appearance. She is not ill-appearing.  HENT:     Head: Normocephalic and atraumatic.     Right Ear: External ear normal.     Left Ear: External ear normal.  Eyes:     Extraocular Movements: Extraocular movements intact.  Cardiovascular:     Rate and Rhythm: Normal rate.     Pulses: Normal pulses.  Pulmonary:     Effort: Pulmonary effort is normal. No respiratory distress.  Abdominal:     General: There is no distension.     Palpations: Abdomen is soft.  Musculoskeletal:  General: Tenderness present.     Cervical back: Neck supple.     Right lower leg: No edema.     Left lower leg: No edema.     Comments: Patient has good distal strength with no pain over the greater trochanters.  No clonus or focal weakness.  Skin:    Findings: No erythema, lesion or rash.  Neurological:     General: No focal deficit present.     Mental Status: She is alert and oriented to person, place, and time.     Sensory: No sensory deficit.     Motor: No weakness or abnormal muscle tone.     Coordination: Coordination normal.  Psychiatric:        Mood and Affect: Mood normal.        Behavior: Behavior normal.      Imaging: No results  found.

## 2023-06-14 NOTE — Procedures (Signed)
 Lumbosacral Transforaminal Epidural Steroid Injection - Sub-Pedicular Approach with Fluoroscopic Guidance  Patient: Robin Carlson      Date of Birth: 07-23-1965 MRN: [id number] PCP: Pcp, No      Visit Date: 06/14/2023   Universal Protocol:    Date/Time: 06/14/2023  Consent Given By: the patient  Position: PRONE  Additional Comments: Vital signs were monitored before and after the procedure. Patient was prepped and draped in the usual sterile fashion. The correct patient, procedure, and site was verified.   Injection Procedure Details:   Procedure diagnoses: Lumbar radiculopathy [M54.16]    Meds Administered:  Meds ordered this encounter  Medications   methylPREDNISolone acetate (DEPO-MEDROL) injection 40 mg    Laterality: Left  Location/Site: L4  Needle:5.0 in., 22 ga.  Short bevel or Quincke spinal needle  Needle Placement: Transforaminal  Findings:    -Comments: Excellent flow of contrast along the nerve, nerve root and into the epidural space. Patient was very nervous with pain response just as foramen. Medication was delivered adequately but she would benefit from oral or IV sedation for any future injection.  Procedure Details: After squaring off the end-plates to get a true AP view, the C-arm was positioned so that an oblique view of the foramen as noted above was visualized. The target area is just inferior to the "nose of the scotty dog" or sub pedicular. The soft tissues overlying this structure were infiltrated with 2-3 ml. of 1% Lidocaine without Epinephrine.  The spinal needle was inserted toward the target using a "trajectory" view along the fluoroscope beam.  Under AP and lateral visualization, the needle was advanced so it did not puncture dura and was located close the 6 O'Clock position of the pedical in AP tracterory. Biplanar projections were used to confirm position. Aspiration was confirmed to be negative for CSF and/or blood. A 1-2 ml. volume of  Isovue-250 was injected and flow of contrast was noted at each level. Radiographs were obtained for documentation purposes.   After attaining the desired flow of contrast documented above, a 0.5 to 1.0 ml test dose of 0.25% Marcaine was injected into each respective transforaminal space.  The patient was observed for 90 seconds post injection.  After no sensory deficits were reported, and normal lower extremity motor function was noted,   the above injectate was administered so that equal amounts of the injectate were placed at each foramen (level) into the transforaminal epidural space.   Additional Comments:  No complications occurred Dressing: 2 x 2 sterile gauze and Band-Aid    Post-procedure details: Patient was observed during the procedure. Post-procedure instructions were reviewed.  Patient left the clinic in stable condition.

## 2023-06-14 NOTE — Progress Notes (Signed)
 Pain Score-10 No Blood Thinners No Allergies to Contrast Dye

## 2023-11-21 ENCOUNTER — Emergency Department (HOSPITAL_COMMUNITY)
Admission: EM | Admit: 2023-11-21 | Discharge: 2023-11-22 | Disposition: A | Discharge status: Home / Self Care | Payer: Worker's Compensation | Attending: Emergency Medicine | Admitting: Emergency Medicine

## 2023-11-21 ENCOUNTER — Emergency Department (HOSPITAL_COMMUNITY): Payer: Worker's Compensation

## 2023-11-21 ENCOUNTER — Other Ambulatory Visit: Payer: Self-pay

## 2023-11-21 DIAGNOSIS — F17200 Nicotine dependence, unspecified, uncomplicated: Secondary | ICD-10-CM | POA: Insufficient documentation

## 2023-11-21 DIAGNOSIS — M545 Low back pain, unspecified: Secondary | ICD-10-CM | POA: Insufficient documentation

## 2023-11-21 NOTE — ED Provider Triage Note (Signed)
 Emergency Medicine Provider Triage Evaluation Note  Robin Carlson , a 58 y.o. female  was evaluated in triage.  Pt complains of fall, low back pain no red flag symptoms..  Review of Systems  Positive: LB pain Negative: Fever  Physical Exam  BP 114/83 (BP Location: Right Arm)   Pulse 69   Temp 98.3 F (36.8 C)   Resp 15   SpO2 100%  Gen:   Awake, no distress   Resp:  Normal effort  MSK:   Moves extremities without difficulty  Other:    Medical Decision Making  Medically screening exam initiated at 7:34 PM.  Appropriate orders placed.  Robin Carlson was informed that the remainder of the evaluation will be completed by another provider, this initial triage assessment does not replace that evaluation, and the importance of remaining in the ED until their evaluation is complete.     Shermon Warren SAILOR, [place] 11/21/23 8065

## 2023-11-21 NOTE — ED Triage Notes (Signed)
 Patient tripped and fell at work last Friday and injured her lower back with pain , denies LOC/ambulatory.

## 2023-11-22 ENCOUNTER — Non-Acute Institutional Stay (HOSPITAL_COMMUNITY): Admission: EM | Admit: 2023-11-22 | Source: Ambulatory Visit

## 2023-11-22 MED ORDER — NAPROXEN 500 MG PO TABS: 500.0000 mg | ORAL_TABLET | Freq: Two times a day (BID) | ORAL | 0 refills | Status: AC

## 2023-11-22 MED ORDER — NAPROXEN 250 MG PO TABS
500.0000 mg | ORAL_TABLET | Freq: Once | ORAL | Status: AC
  Administered 2023-11-22: 500 mg via ORAL
  Filled 2023-11-22: qty 2

## 2023-11-22 MED ORDER — LIDOCAINE 5 % EX PTCH
1.0000 | MEDICATED_PATCH | CUTANEOUS | Status: DC
  Administered 2023-11-22: 1 via TRANSDERMAL
  Filled 2023-11-22: qty 1

## 2023-11-22 NOTE — Discharge Instructions (Signed)
 Your x-ray was reassuring this morning.  Please follow-up with a primary care provider or other provider as recommended by your Worker's Comp. representative as needed.  Please take anti-inflammatory medication as prescribed.  You may also use lidocaine  patches if you find it beneficial.  Consider applying heat to the low back area.  Rest as you are able.  If you develop any life-threatening symptoms such as inability to urinate please return to the emergency department.

## 2023-11-22 NOTE — ED Provider Notes (Signed)
 [place] EMERGENCY DEPARTMENT AT [facility] Provider Note   CSN: [id number] Arrival date & time: 11/21/23  1857     Patient presents with: Back Pain   Robin Carlson is a 58 y.o. female.  Patient with no relevant past medical history on file presents to the emergency department complaining of low back pain.  Patient states that last Friday she tripped over a pallet jack while at work, falling and hitting her upper buttocks, low back.  She has been ambulatory since that time.  She denies urinary incontinence, urinary retention, fecal incontinence, saddle anesthesia.  She denies any weakness.    Back Pain      Prior to Admission medications   Medication Sig Start Date End Date Taking? Authorizing Provider  naproxen  (NAPROSYN ) 500 MG tablet Take 1 tablet (500 mg total) by mouth 2 (two) times daily. 11/22/23  Yes Logan Ubaldo NOVAK, PA-C  diclofenac  sodium (VOLTAREN ) 1 % GEL Apply 2 g topically 4 (four) times daily. Rub into affected area of foot 2 to 4 times daily 02/23/18   Gershon Donnice SAUNDERS, DPM  fluticasone  (FLONASE ) 50 MCG/ACT nasal spray SPRAY 2 SPRAYS INTO EACH NOSTRIL EVERY DAY 10/09/17   Kathrin Mignon DASEN, MD  NON FORMULARY     [provider]  NON FORMULARY [facility]  Onychomycosis Nail Lacquer -  Fluconazole 2%, Terbinafine 1% DMSO/undecylenic acid 25% Apply to affected nail once daily Qty. 120 gm 3 refills    [provider]    Allergies: Patient has no known allergies.    Review of Systems  Musculoskeletal:  Positive for back pain.    Updated Vital Signs BP 120/73 (BP Location: Right Arm)   Pulse (!) 58   Temp 98.2 F (36.8 C) (Oral)   Resp (!) 22   SpO2 100%   Physical Exam Vitals and nursing note reviewed.  Constitutional:      General: She is not in acute distress.    Appearance: She is well-developed.  HENT:     Head: Normocephalic and atraumatic.  Eyes:     Conjunctiva/sclera: Conjunctivae normal.  Cardiovascular:      Rate and Rhythm: Normal rate and regular rhythm.     Heart sounds: No murmur heard. Pulmonary:     Effort: Pulmonary effort is normal. No respiratory distress.     Breath sounds: Normal breath sounds.  Abdominal:     Palpations: Abdomen is soft.     Tenderness: There is no abdominal tenderness.  Musculoskeletal:        General: Tenderness present. No swelling.     Cervical back: Neck supple.     Comments: Tenderness across the lumbar region with no midline spinal tenderness appreciated.  Bilateral lower extremity strength 5/5.  Sensation intact bilaterally in lower extremities.  Skin:    General: Skin is warm and dry.     Capillary Refill: Capillary refill takes less than 2 seconds.  Neurological:     Mental Status: She is alert.  Psychiatric:        Mood and Affect: Mood normal.     (all labs ordered are listed, but only abnormal results are displayed) Labs Reviewed - No data to display  EKG: None  Radiology: DG Lumbar Spine Complete Result Date: 11/21/2023 CLINICAL DATA:  Fall EXAM: LUMBAR SPINE - COMPLETE 4+ VIEW COMPARISON:  Radiograph dated 03/03/2023. FINDINGS: Five lumbar type vertebra. There is no acute fracture or subluxation of the lumbar spine. Mild degenerative changes and mild levoscoliosis  centered at L2-L3. The visualized posterior elements are intact. The soft tissues are unremarkable. IMPRESSION: 1. No acute findings. 2. Mild degenerative changes and mild levoscoliosis. Electronically Signed   By: Vanetta Chou M.D.   On: 11/21/2023 21:07     Procedures   Medications Ordered in the ED  lidocaine  (LIDODERM ) 5 % 1 patch (has no administration in time range)  naproxen  (NAPROSYN ) tablet 500 mg (has no administration in time range)                                    Medical Decision Making Risk Prescription drug management.   This patient presents to the ED for concern of low back pain, this involves an extensive number of treatment options, and is  a complaint that carries with it a high risk of complications and morbidity.  The differential diagnosis includes fracture, dislocation, soft tissue injury, herniation of disc, cauda equina, others   Co morbidities / Chronic conditions that complicate the patient evaluation  None   Additional history obtained:  Additional history obtained from EMR   Imaging Studies ordered:  I ordered imaging studies including plain films of the lumbar spine I independently visualized and interpreted imaging which showed  1. No acute findings.  2. Mild degenerative changes and mild levoscoliosis.    Problem List / ED Course / Critical interventions / Medication management   I ordered medication including Lidoderm  patch and Naprosyn  Reevaluation of the patient after these medicines showed that the patient improved   Social Determinants of Health:  Patient is a daily smoker   Test / Admission - Considered:  Patient with low back pain but no radiculopathy, no midline tenderness.  She has no red flag symptoms to suggest cauda equina.  She is ambulatory without difficulty.  No neurologic deficit.  Plan to discharge home with recommendations for lidocaine  patches and Naprosyn .  Patient to follow-up with primary care for further management as needed.      Final diagnoses:  Acute bilateral low back pain without sciatica    ED Discharge Orders          Ordered    naproxen  (NAPROSYN ) 500 MG tablet  2 times daily        11/22/23 0204               Logan Ubaldo NOVAK, PA-C 11/22/23 0232    Trine Raynell Moder, MD 11/22/23 [phone]

## 2024-01-15 ENCOUNTER — Ambulatory Visit (INDEPENDENT_AMBULATORY_CARE_PROVIDER_SITE_OTHER): Payer: Worker's Compensation

## 2024-01-15 ENCOUNTER — Ambulatory Visit (INDEPENDENT_AMBULATORY_CARE_PROVIDER_SITE_OTHER): Payer: Worker's Compensation | Admitting: Orthopedic Surgery

## 2024-01-15 DIAGNOSIS — M5416 Radiculopathy, lumbar region: Secondary | ICD-10-CM

## 2024-01-15 MED ORDER — METHYLPREDNISOLONE 4 MG PO TBPK: ORAL_TABLET | ORAL | 0 refills | Status: AC

## 2024-01-15 NOTE — Progress Notes (Signed)
 Orthopedic Spine Surgery Office Note   Assessment: Patient is a 58 y.o. female who had low back pain that radiated into the left lower extremity.  Her radiating leg pain has resolved since an injection with Dr. Eldonna.  She comes in today for acute worsening of her chronic low back pain after a fall at work     Plan: -Patient has tried PT, gabapentin, Tylenol , muscle relaxer, hydrocodone , pain management -Prescribed a Medrol  Dosepak for additional pain relief -Should avoid lifting more than 10 pounds for the next 3 weeks then can return to work with no restriction -Discussed L4/5 facet injections as another treatment option since her pain is worse with extension and seems related to the facets -She is currently not a candidate for elective spine surgery as she is still using nicotine -Patient should return to office on an as-needed basis     Patient expressed understanding of the plan and all questions were answered to the patient's satisfaction.    ___________________________________________________________________________     History:   Patient is a 58 y.o. female who presents today for follow-up on her lumbar spine.  Patient has had chronic low back pain but recently had a fall at work that caused acute exacerbation of worsening back pain.  She has had no radiating left leg pain since her injection with Dr. Eldonna on 06/14/2023.  She is pleased with how that injection worked.  Her back pain is felt in the lower lumbar region.  She has been taking hydrocodone  and using Voltaren  gel but that has not helped.   Still actively using nicotine.   Treatments tried: PT, gabapentin, Tylenol , muscle relaxer, hydrocodone , pain management     Physical Exam:   General: no acute distress, appears stated age Neurologic: alert, answering questions appropriately, following commands Respiratory: unlabored breathing on room air, symmetric chest rise Psychiatric: appropriate affect, normal cadence to  speech     MSK (spine):   -Strength exam                                                   Left                  Right EHL                              5/5                  5/5 TA                                 5/5                  5/5 GSC                             5/5                  5/5 Knee extension            5/5                  5/5 Hip flexion  5/5                  5/5   -Sensory exam                           Sensation intact to light touch in L3-S1 nerve distributions of bilateral lower extremities  Pain is worse with extension.  Improves with flexion.   Imaging: XRs of the lumbar spine from 01/15/2024 were independently reviewed and interpreted, showing disc height loss at L4/5 and L5/S1.  Vacuum disc phenomenon seen on the extension view at L4/5.  Facet arthropathy at L4/5.  No other significant degenerative changes seen.  No evidence of instability on flexion/extension views.  No fracture or dislocation seen.   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  Foraminal stenosis bilaterally at L5/S1.  No other significant stenosis seen.     Patient name: Robin Carlson Patient MRN: [id number] Date of visit: 01/15/24

## 2024-02-19 ENCOUNTER — Encounter: Payer: Self-pay | Admitting: Radiology
